# Patient Record
Sex: Female | Born: 1985 | Race: Black or African American | Hispanic: No | Marital: Single | Smoking: Current every day smoker
Health system: Southern US, Community
[De-identification: ages and names within clinical notes are randomized; demographics above are authoritative.]

## PROBLEM LIST (undated history)

## (undated) DIAGNOSIS — O2230 Deep phlebothrombosis in pregnancy, unspecified trimester: Secondary | ICD-10-CM

## (undated) DIAGNOSIS — R87629 Unspecified abnormal cytological findings in specimens from vagina: Secondary | ICD-10-CM

## (undated) DIAGNOSIS — I2699 Other pulmonary embolism without acute cor pulmonale: Secondary | ICD-10-CM

## (undated) HISTORY — DX: Unspecified abnormal cytological findings in specimens from vagina: R87.629

## (undated) HISTORY — PX: DILATION AND CURETTAGE OF UTERUS: SHX78

---

## 2017-12-20 NOTE — L&D Delivery Note (Signed)
Delivery Note At 9:04 AM a non-viable female was delivered via Vaginal, Spontaneous (Presentation: vertex).  APGAR: 0, 0; weight 2 lb 0.5 oz (920 g).   Placenta status:delivered, intact.  Cord: 3vc with the following complications: none.   Appearance of the baby showed no integrity to the fetal cranium, cord brown. No sloughing of skin.  Anesthesia:  fentanyl Episiotomy: None Lacerations: None Est. Blood Loss (mL): 100  Mom to postpartum.  Baby to [place].  Levie Heritage 10/10/2018, 12:02 PM

## 2018-10-10 ENCOUNTER — Inpatient Hospital Stay (HOSPITAL_COMMUNITY)
Admission: EM | Admit: 2018-10-10 | Discharge: 2018-10-11 | DRG: 805 | Disposition: A | Discharge status: Home / Self Care | Payer: 59 | Attending: Family Medicine | Admitting: Family Medicine

## 2018-10-10 ENCOUNTER — Emergency Department (HOSPITAL_COMMUNITY): Payer: 59

## 2018-10-10 ENCOUNTER — Other Ambulatory Visit: Payer: Self-pay

## 2018-10-10 ENCOUNTER — Encounter (HOSPITAL_COMMUNITY): Payer: Self-pay | Admitting: *Deleted

## 2018-10-10 DIAGNOSIS — Z86711 Personal history of pulmonary embolism: Secondary | ICD-10-CM

## 2018-10-10 DIAGNOSIS — Z87891 Personal history of nicotine dependence: Secondary | ICD-10-CM

## 2018-10-10 DIAGNOSIS — O4593 Premature separation of placenta, unspecified, third trimester: Secondary | ICD-10-CM | POA: Diagnosis present

## 2018-10-10 DIAGNOSIS — O4700 False labor before 37 completed weeks of gestation, unspecified trimester: Secondary | ICD-10-CM

## 2018-10-10 DIAGNOSIS — Z3689 Encounter for other specified antenatal screening: Secondary | ICD-10-CM

## 2018-10-10 DIAGNOSIS — Z3A34 34 weeks gestation of pregnancy: Secondary | ICD-10-CM

## 2018-10-10 DIAGNOSIS — O364XX0 Maternal care for intrauterine death, not applicable or unspecified: Principal | ICD-10-CM | POA: Diagnosis present

## 2018-10-10 DIAGNOSIS — O479 False labor, unspecified: Secondary | ICD-10-CM

## 2018-10-10 HISTORY — DX: Other pulmonary embolism without acute cor pulmonale: I26.99

## 2018-10-10 LAB — HIV ANTIBODY (ROUTINE TESTING W REFLEX): HIV SCREEN 4TH GENERATION: NONREACTIVE

## 2018-10-10 LAB — COMPREHENSIVE METABOLIC PANEL
ALT: 15 U/L (ref 0–44)
AST: 24 U/L (ref 15–41)
Albumin: 3 g/dL — ABNORMAL LOW (ref 3.5–5.0)
Alkaline Phosphatase: 95 U/L (ref 38–126)
Anion gap: 9 (ref 5–15)
BILIRUBIN TOTAL: 0.5 mg/dL (ref 0.3–1.2)
BUN: 7 mg/dL (ref 6–20)
CO2: 17 mmol/L — ABNORMAL LOW (ref 22–32)
Calcium: 9 mg/dL (ref 8.9–10.3)
Chloride: 107 mmol/L (ref 98–111)
Creatinine, Ser: 0.58 mg/dL (ref 0.44–1.00)
GFR calc Af Amer: >60 mL/min (ref >60)
GFR calc non Af Amer: >60 mL/min (ref >60)
GLUCOSE: 74 mg/dL (ref 70–99)
POTASSIUM: 3.5 mmol/L (ref 3.5–5.1)
Sodium: 133 mmol/L — ABNORMAL LOW (ref 135–145)
TOTAL PROTEIN: 6.7 g/dL (ref 6.5–8.1)

## 2018-10-10 LAB — APTT: aPTT: 29 seconds (ref 24–36)

## 2018-10-10 LAB — TYPE AND SCREEN
ABO/RH(D): B POS
ANTIBODY SCREEN: NEGATIVE

## 2018-10-10 LAB — CBC WITH DIFFERENTIAL/PLATELET
ABS IMMATURE GRANULOCYTES: 0.03 10*3/uL (ref 0.00–0.07)
BASOS ABS: 0 10*3/uL (ref 0.0–0.1)
Basophils Relative: 0 %
EOS ABS: 0.2 10*3/uL (ref 0.0–0.5)
Eosinophils Relative: 2 %
HEMATOCRIT: 39.3 % (ref 36.0–46.0)
HEMOGLOBIN: 13.6 g/dL (ref 12.0–15.0)
IMMATURE GRANULOCYTES: 0 %
LYMPHS ABS: 1.7 10*3/uL (ref 0.7–4.0)
LYMPHS PCT: 20 %
MCH: 31.1 pg (ref 26.0–34.0)
MCHC: 34.6 g/dL (ref 30.0–36.0)
MCV: 89.9 fL (ref 80.0–100.0)
MONOS PCT: 9 %
Monocytes Absolute: 0.8 10*3/uL (ref 0.1–1.0)
NEUTROS ABS: 5.8 10*3/uL (ref 1.7–7.7)
NEUTROS PCT: 69 %
NRBC: 0 % (ref 0.0–0.2)
Platelets: 181 10*3/uL (ref 150–400)
RBC: 4.37 MIL/uL (ref 3.87–5.11)
RDW: 13.8 % (ref 11.5–15.5)
WBC: 8.4 10*3/uL (ref 4.0–10.5)

## 2018-10-10 LAB — PROTIME-INR
INR: 0.96
Prothrombin Time: 12.7 seconds (ref 11.4–15.2)

## 2018-10-10 LAB — RPR: RPR: NONREACTIVE

## 2018-10-10 LAB — ABO/RH: ABO/RH(D): B POS

## 2018-10-10 MED ORDER — LACTATED RINGERS IV SOLN: 500.0000 mL | INTRAVENOUS | Status: DC | PRN

## 2018-10-10 MED ORDER — OXYTOCIN 40 UNITS IN LACTATED RINGERS INFUSION - SIMPLE MED
2.5000 [IU]/h | INTRAVENOUS | Status: DC
  Filled 2018-10-10: qty 1000

## 2018-10-10 MED ORDER — LACTATED RINGERS IV SOLN
INTRAVENOUS | Status: DC
  Administered 2018-10-10: 06:00:00 via INTRAVENOUS

## 2018-10-10 MED ORDER — ONDANSETRON HCL 4 MG/2ML IJ SOLN: 4.0000 mg | Freq: Four times a day (QID) | INTRAMUSCULAR | Status: DC | PRN

## 2018-10-10 MED ORDER — ENOXAPARIN SODIUM 30 MG/0.3ML ~~LOC~~ SOLN: 40.0000 mg | SUBCUTANEOUS | Status: DC

## 2018-10-10 MED ORDER — LIDOCAINE HCL (PF) 1 % IJ SOLN
30.0000 mL | INTRAMUSCULAR | Status: DC | PRN
  Filled 2018-10-10: qty 30

## 2018-10-10 MED ORDER — SOD CITRATE-CITRIC ACID 500-334 MG/5ML PO SOLN: 30.0000 mL | ORAL | Status: DC | PRN

## 2018-10-10 MED ORDER — ACETAMINOPHEN 325 MG PO TABS
650.0000 mg | ORAL_TABLET | ORAL | Status: DC | PRN
  Administered 2018-10-11: 650 mg via ORAL
  Filled 2018-10-10: qty 2

## 2018-10-10 MED ORDER — OXYCODONE-ACETAMINOPHEN 5-325 MG PO TABS: 1.0000 | ORAL_TABLET | ORAL | Status: DC | PRN

## 2018-10-10 MED ORDER — LACTATED RINGERS IV SOLN: INTRAVENOUS | Status: DC

## 2018-10-10 MED ORDER — PRENATAL MULTIVITAMIN CH
1.0000 | ORAL_TABLET | Freq: Every day | ORAL | Status: DC
  Administered 2018-10-11: 1 via ORAL
  Filled 2018-10-10: qty 1

## 2018-10-10 MED ORDER — ESCITALOPRAM OXALATE 10 MG PO TABS
10.0000 mg | ORAL_TABLET | Freq: Every day | ORAL | Status: DC
  Administered 2018-10-10 – 2018-10-11 (×2): 10 mg via ORAL
  Filled 2018-10-10 (×3): qty 1

## 2018-10-10 MED ORDER — ACETAMINOPHEN 325 MG PO TABS: 650.0000 mg | ORAL_TABLET | ORAL | Status: DC | PRN

## 2018-10-10 MED ORDER — WITCH HAZEL-GLYCERIN EX PADS: 1.0000 "application " | MEDICATED_PAD | CUTANEOUS | Status: DC | PRN

## 2018-10-10 MED ORDER — SIMETHICONE 80 MG PO CHEW: 80.0000 mg | CHEWABLE_TABLET | ORAL | Status: DC | PRN

## 2018-10-10 MED ORDER — ZOLPIDEM TARTRATE 5 MG PO TABS
5.0000 mg | ORAL_TABLET | Freq: Every evening | ORAL | Status: DC | PRN
  Administered 2018-10-10: 5 mg via ORAL
  Filled 2018-10-10: qty 1

## 2018-10-10 MED ORDER — OXYCODONE-ACETAMINOPHEN 5-325 MG PO TABS: 2.0000 | ORAL_TABLET | ORAL | Status: DC | PRN

## 2018-10-10 MED ORDER — DIBUCAINE 1 % RE OINT: 1.0000 "application " | TOPICAL_OINTMENT | RECTAL | Status: DC | PRN

## 2018-10-10 MED ORDER — MORPHINE SULFATE (PF) 4 MG/ML IV SOLN
2.0000 mg | INTRAVENOUS | Status: DC | PRN
  Administered 2018-10-10: 2 mg via INTRAVENOUS
  Filled 2018-10-10: qty 1

## 2018-10-10 MED ORDER — OXYTOCIN 40 UNITS IN LACTATED RINGERS INFUSION - SIMPLE MED: 1.0000 m[IU]/min | INTRAVENOUS | Status: DC

## 2018-10-10 MED ORDER — FENTANYL CITRATE (PF) 100 MCG/2ML IJ SOLN
50.0000 ug | INTRAMUSCULAR | Status: DC | PRN
  Administered 2018-10-10: 100 ug via INTRAVENOUS
  Filled 2018-10-10: qty 2

## 2018-10-10 MED ORDER — ENOXAPARIN SODIUM 30 MG/0.3ML ~~LOC~~ SOLN
30.0000 mg | SUBCUTANEOUS | Status: DC
  Administered 2018-10-10: 30 mg via SUBCUTANEOUS
  Filled 2018-10-10: qty 0.3

## 2018-10-10 MED ORDER — ONDANSETRON HCL 4 MG/2ML IJ SOLN: 4.0000 mg | INTRAMUSCULAR | Status: DC | PRN

## 2018-10-10 MED ORDER — ONDANSETRON HCL 4 MG PO TABS: 4.0000 mg | ORAL_TABLET | ORAL | Status: DC | PRN

## 2018-10-10 MED ORDER — BENZOCAINE-MENTHOL 20-0.5 % EX AERO: 1.0000 "application " | INHALATION_SPRAY | CUTANEOUS | Status: DC | PRN

## 2018-10-10 MED ORDER — TETANUS-DIPHTH-ACELL PERTUSSIS 5-2.5-18.5 LF-MCG/0.5 IM SUSP: 0.5000 mL | Freq: Once | INTRAMUSCULAR | Status: DC

## 2018-10-10 MED ORDER — DIPHENHYDRAMINE HCL 25 MG PO CAPS: 25.0000 mg | ORAL_CAPSULE | Freq: Four times a day (QID) | ORAL | Status: DC | PRN

## 2018-10-10 MED ORDER — OXYTOCIN BOLUS FROM INFUSION
500.0000 mL | Freq: Once | INTRAVENOUS | Status: AC
  Administered 2018-10-10: 500 mL via INTRAVENOUS

## 2018-10-10 MED ORDER — SENNOSIDES-DOCUSATE SODIUM 8.6-50 MG PO TABS
2.0000 | ORAL_TABLET | ORAL | Status: DC
  Administered 2018-10-11: 2 via ORAL
  Filled 2018-10-10: qty 2

## 2018-10-10 MED ORDER — COCONUT OIL OIL: 1.0000 "application " | TOPICAL_OIL | Status: DC | PRN

## 2018-10-10 MED ORDER — IBUPROFEN 600 MG PO TABS
600.0000 mg | ORAL_TABLET | Freq: Four times a day (QID) | ORAL | Status: DC
  Administered 2018-10-10 – 2018-10-11 (×3): 600 mg via ORAL
  Filled 2018-10-10 (×3): qty 1

## 2018-10-10 NOTE — ED Notes (Signed)
Patient immediately placed in a gown and placed on external fetal monitor.  Fetal HR of 144 found.  Marylene Land RN at bedside, along with [place], [place].

## 2018-10-10 NOTE — H&P (Signed)
Obstetric History and Physical  Chelsea Hall is a 32 y.o. [id number] with IUP at [redacted]w[redacted]d presenting for contractions. Found to be 4 cm. Denies bleeding or leaking of fluid. Seen at [facility] ED urgently for concern for fetal heart rate with IUFD confirmed bedside by myself. Transferred to [facility] for induction/augmentation of her labor.  Prenatal Course Source of Care: [place], has not been seen since Sept 1 Pregnancy complications or risks: Patient Active Problem List   Diagnosis Date Noted  . Fetal demise, greater than 22 weeks, antepartum 10/10/2018  . Pulmonary embolus [facility])    Medical History:  Past Medical History:  Diagnosis Date  . Pulmonary embolus [facility])     Past Surgical History:  Procedure Laterality Date  . DILATION AND CURETTAGE OF UTERUS      OB History  Gravida Para Term Preterm AB Living  4 1 1   2 1   SAB TAB Ectopic Multiple Live Births  2       1    # Outcome Date GA Lbr Len/2nd Weight Sex Delivery Anes PTL Lv  4 Current           3 SAB           2 SAB           1 Term         LIV    Social History   Socioeconomic History  . Marital status: Single    Spouse name: Not on file  . Number of children: Not on file  . Years of education: Not on file  . Highest education level: Not on file  Occupational History  . Not on file  Social Needs  . Financial resource strain: Not on file  . Food insecurity:    Worry: Not on file    Inability: Not on file  . Transportation needs:    Medical: Not on file    Non-medical: Not on file  Tobacco Use  . Smoking status: Former Games developer  . Smokeless tobacco: Never Used  Substance and Sexual Activity  . Alcohol use: Not Currently  . Drug use: Never  . Sexual activity: Not on file  Lifestyle  . Physical activity:    Days per week: Not on file    Minutes per session: Not on file  . Stress: Not on file  Relationships  . Social connections:    Talks on phone: Not on file    Gets together: Not on file    Attends religious  service: Not on file    Active member of club or organization: Not on file    Attends meetings of clubs or organizations: Not on file    Relationship status: Not on file  Other Topics Concern  . Not on file  Social History Narrative  . Not on file    History reviewed. No pertinent family history.  Medications Prior to Admission  Medication Sig Dispense Refill Last Dose  . enoxaparin (LOVENOX) 30 MG/0.3ML injection Inject 30 mg into the skin daily.   10/09/2018 at Unknown time    Allergies  Allergen Reactions  . Shellfish Allergy     Review of Systems: Negative except for what is mentioned in HPI.  Physical Exam: BP 137/86 (BP Location: Right Arm)   Pulse 86   Temp 98.4 F (36.9 C) (Oral)   Resp (!) 24   Ht 5\' 5"  (1.651 m)   Wt 62.6 kg   LMP 02/19/2018 (Within Weeks)   SpO2  100%   BMI 22.96 kg/m  CONSTITUTIONAL: Well-developed, well-nourished female in mild distress.  HENT:  Normocephalic, atraumatic, External right and left ear normal. Oropharynx is clear and moist EYES: Conjunctivae and EOM are normal. Pupils are equal, round, and reactive to light. No scleral icterus.  NECK: Normal range of motion, supple, no masses SKIN: Skin is warm and dry. No rash noted. Not diaphoretic. No erythema. No pallor. NEUROLOGIC: Alert and oriented to person, place, and time. Normal reflexes, muscle tone coordination. No cranial nerve deficit noted. PSYCHIATRIC: Normal mood and affect. Normal behavior. Normal judgment and thought content. CARDIOVASCULAR: Normal heart rate noted, regular rhythm RESPIRATORY: Effort and breath sounds normal, no problems with respiration noted ABDOMEN: Soft, nontender, nondistended, gravid. MUSCULOSKELETAL: Normal range of motion. No edema and no tenderness. 2+ distal pulses.  Cervical Exam: Dilatation 4 cm Presentation: cephalic FHT:  none Contractions: Every 5 mins   Pertinent Labs/Studies:   Results for orders placed or performed during the  hospital encounter of 10/10/18 (from the past 24 hour(s))  CBC with Differential     Status: None   Collection Time: 10/10/18  7:31 AM  Result Value Ref Range   WBC 8.4 4.0 - 10.5 K/uL   RBC 4.37 3.87 - 5.11 MIL/uL   Hemoglobin 13.6 12.0 - 15.0 g/dL   HCT 67.8 93.8 - 10.1 %   MCV 89.9 80.0 - 100.0 fL   MCH 31.1 26.0 - 34.0 pg   MCHC 34.6 30.0 - 36.0 g/dL   RDW 75.1 02.5 - 85.2 %   Platelets 181 150 - 400 K/uL   nRBC 0.0 0.0 - 0.2 %   Neutrophils Relative % 69 %   Neutro Abs 5.8 1.7 - 7.7 K/uL   Lymphocytes Relative 20 %   Lymphs Abs 1.7 0.7 - 4.0 K/uL   Monocytes Relative 9 %   Monocytes Absolute 0.8 0.1 - 1.0 K/uL   Eosinophils Relative 2 %   Eosinophils Absolute 0.2 0.0 - 0.5 K/uL   Basophils Relative 0 %   Basophils Absolute 0.0 0.0 - 0.1 K/uL   Immature Granulocytes 0 %   Abs Immature Granulocytes 0.03 0.00 - 0.07 K/uL  Comprehensive metabolic panel     Status: Abnormal   Collection Time: 10/10/18  7:31 AM  Result Value Ref Range   Sodium 133 (L) 135 - 145 mmol/L   Potassium 3.5 3.5 - 5.1 mmol/L   Chloride 107 98 - 111 mmol/L   CO2 17 (L) 22 - 32 mmol/L   Glucose, Bld 74 70 - 99 mg/dL   BUN 7 6 - 20 mg/dL   Creatinine, Ser 7.78 0.44 - 1.00 mg/dL   Calcium 9.0 8.9 - 24.2 mg/dL   Total Protein 6.7 6.5 - 8.1 g/dL   Albumin 3.0 (L) 3.5 - 5.0 g/dL   AST 24 15 - 41 U/L   ALT 15 0 - 44 U/L   Alkaline Phosphatase 95 38 - 126 U/L   Total Bilirubin 0.5 0.3 - 1.2 mg/dL   GFR calc non Af Amer >60 >60 mL/min   GFR calc Af Amer >60 >60 mL/min   Anion gap 9 5 - 15  Protime-INR     Status: None   Collection Time: 10/10/18  7:31 AM  Result Value Ref Range   Prothrombin Time 12.7 11.4 - 15.2 seconds   INR 0.96   APTT     Status: None   Collection Time: 10/10/18  7:31 AM  Result Value Ref Range  aPTT 29 24 - 36 seconds    Assessment : Chelsea Hall is a 32 y.o. G1P0 at [redacted]w[redacted]d being admitted for labor/augmentation with IUFD. IUFD confirmed by myself on bedside US at [facility] ED  after being called there urgently for concern for fetal well being. Fetus measuring [redacted]w[redacted]d with no fluid noted. She is 4 cm and contracting, will augment as needed.  Plan: Admit to L&D PEC, Admission labs ordered SW consult Pain relief prn Pitocin as needed Will cont lovenox post delivery     K. Therese Sarah, M.D. Center for [facility]  10/10/2018, 8:35 AM

## 2018-10-10 NOTE — ED Triage Notes (Signed)
Pt reports ongoing contractions since 2am that started out every hour and have become more frequent. G2P1, due 11/19/18. Pt is visiting from [place] where her OB is currently. LMP in March

## 2018-10-10 NOTE — Progress Notes (Signed)
RROB called to patient's bedside who presents to [facility] ED with complaints of contractions; upon arriving to patient's bedside patient stated she was from [place] visiting her mom; she is a G4P1 at 69 and 2/[redacted] weeks along in this pregnancy at this time; audible fetal movement noted immediately upon entering room; EFM adjusted for several minutes in attempts to find FHR;  LSaunders, PA and Dr Elesa Massed called to bedside to assist with bedside ultrasound to find FHR; Dr Earlene Plater called to come to patient's bedside for further assistance; limited OB ultrasound ordered Stat while Dr Earlene Plater at bedside

## 2018-10-10 NOTE — ED Provider Notes (Signed)
[facility] EMERGENCY DEPARTMENT Provider Note   CSN: [id number] Arrival date & time: 10/10/18  4098     History   Chief Complaint Chief Complaint  Patient presents with  . Laboring    HPI Tonisha Silvey is a 32 y.o. female.  The history is provided by the patient and medical records.    32 year old G2, P1 approximately 7 months gestation presenting to the ED with contractions.  This started around 2 AM.  Initially every 3 to 4 minutes, now feels more like every 2 minutes or so.  States it feels like a lot of pressure in her butt.  She denies any vaginal bleeding or loss of fluids.  States pregnancy thus far only complicated by hyperemesis gravidarum.  She is also on Lovenox injections due to history of DVT with prior pregnancy.  She did have preterm labor with her son, had to be admitted for IV medications to stop labor.  He was ultimately delivered 3 weeks early but no other noted complications.  She has not had any recent fever, chills, urinary symptoms, pelvic pain, or vaginal discharge.  Last OB visit in September 2019 with her OB in [place].  She is not currently on prenatal vitamins as they felt like that was the etiology of her emesis.  History reviewed. No pertinent past medical history.  There are no active problems to display for this patient.   OB History    Gravida  1   Para      Term      Preterm      AB      Living        SAB      TAB      Ectopic      Multiple      Live Births               Home Medications    Prior to Admission medications   Not on File    Family History No family history on file.  Social History Social History   Tobacco Use  . Smoking status: Former Games developer  . Smokeless tobacco: Never Used  Substance Use Topics  . Alcohol use: Not Currently  . Drug use: Never     Allergies   Shellfish allergy   Review of Systems Review of Systems  Gastrointestinal:       Contractions  All other systems  reviewed and are negative.    Physical Exam Updated Vital Signs BP (!) 141/97 (BP Location: Right Arm)   Pulse 86   Temp 98.2 F (36.8 C) (Oral)   Resp 20   Ht 5\' 5"  (1.651 m)   Wt 62.6 kg   LMP 02/19/2018 (Within Weeks)   SpO2 100%   BMI 22.96 kg/m   Physical Exam  Constitutional: She is oriented to person, place, and time. She appears well-developed and well-nourished.  Appears to be having contractions during exam  HENT:  Head: Normocephalic and atraumatic.  Mouth/Throat: Oropharynx is clear and moist.  Eyes: Pupils are equal, round, and reactive to light. Conjunctivae and EOM are normal.  Neck: Normal range of motion.  Cardiovascular: Normal rate, regular rhythm and normal heart sounds.  Pulmonary/Chest: Effort normal and breath sounds normal.  Abdominal: Soft. Bowel sounds are normal.  Gravid abdomen  Musculoskeletal: Normal range of motion.  Neurological: She is alert and oriented to person, place, and time.  Skin: Skin is warm and dry.  Psychiatric: She has a normal  mood and affect.  Nursing note and vitals reviewed.    ED Treatments / Results  Labs (all labs ordered are listed, but only abnormal results are displayed) Labs Reviewed - No data to display  EKG None  Radiology No results found.  Procedures Procedures (including critical care time)  Medications Ordered in ED Medications - No data to display   Initial Impression / Assessment and Plan / ED Course  I have reviewed the triage vital signs and the nursing notes.  Pertinent labs & imaging results that were available during my care of the patient were reviewed by me and considered in my medical decision making (see chart for details).  G2P1 approx 7 months gestation, presenting to the ED with contractions.  States this began around 2 AM, initially every 3 to 4 minutes, now every 2 minutes or so.  States she feels a lot of pressure in her bottom when this happens.  Has history of preterm labor  with her son who was ultimately delivered 3 weeks early.  Pregnancy thus far has been uncomplicated aside from hyperemesis gravidarum which has slowly been tapering off and appetite has increased.  She is on Lovenox injections due to history of DVT with prior pregnancy.  Last OB visit early September.  Patient is hemodynamically stable, nontoxic in appearance.  She denies any vaginal bleeding or loss of fluid.  Abdomen is gravid but soft.  She does appear to be having contractions during exam.  Initially on EFM FHR 144.  Rapid OB RN Wilkie Aye, arrived shortly after my initial assessment.  She has performed cervical check, estimates 4cm dilated.  Patient will be immediately transferred to [facility] hospital for ongoing management.     While facilitating transfer, rapid OB adjusting EFM due to difficulty picking up FHR.  I immediately went in to assess patient with bedside US-- fetus was in awkward, somewhat transverse position so unable to clearly visualize cardiac chambers, question of some trace cardiac activity but unable to doppler.  Attempted to apply pressure to patient's abdomen to try and reposition fetus for better visualization without success.   Rapid OB trying again with portable doppler, still with difficulty picking up FHR.  Rapid OB RN has contacted MAU attending, Dr. Earlene Plater to discuss case and current findings-- she is on her way over to assess patient as no current OB in house.  Meanwhile attending physician, Dr. Elesa Massed into assess with no visible cardiac activity on bedside US.  Second ED attending has come to bedside to evaluate with [place], no visible cardiac seen on their assessment either.  MAU attending, Dr. Earlene Plater arrived in the ED emergently to assess patient-- she is not seeing any visible cardiac activity on bedside ultrasound.  Formal US ordered, confirms no visible cardiac activity.  Fetus was noted to be very small (measuring approx 26 weeks) with very low fluid.  Patient was updated by  OB.  Patient will be transferred to MAU for delivery.    Final Clinical Impressions(s) / ED Diagnoses   Final diagnoses:  Preterm contractions    ED Discharge Orders    None       Garlon Hatchet, PA-C 10/10/18 0809    Ward, Layla Maw, DO 10/10/18 [id number]

## 2018-10-10 NOTE — Progress Notes (Signed)
[facility] Called for patient transfer; ETA 15 minutes

## 2018-10-10 NOTE — Progress Notes (Signed)
Called by Rapid Response RN to come to [facility] for patient @ 34 w with low fetal heart rate. Per ED attending, PA and OB rapid response RN, there had been fetal heart rate initially, but it was not able to be traced on doppler and the FHR was bradycardic on bedside US, at which point I was called.   I presented shortly thereafter and on evaluation with bedside US, there was no fetal activity and no FHR noted. Per ED staff, last confirmed FHR had been 30 min prior. Radiology called urgently for assistance with formal US to confirm no cardiac activity and obtain measurements. No fetal cardiac activity noted, no blood flow to fetal heart noted. Fetus cephalic. No fluid noted. States she is [redacted]w[redacted]d by dates, measuring [redacted]w[redacted]d on bedside limited. I reviewed there was no fetal cardiac activity, indicating a fetal demise. Patient appropriately upset and wondering if [place] could be wrong. I reviewed with her that formal US is confirmatory, and there was no cardiac activity noted while watching over period of several minutes. Patient asked why she was not being urgently delivered, and I reviewed that the risks to her with an urgent c-section are significant with minimal benefit as the fetus would be unlikely to be resuscitated with no cardiac activity. Patient and her mother appropriately upset and asking what next steps are. They appear to understand implications of demise and that fetus will not be born alive. I reviewed she will be transferred to [facility] for labor/augmentation as needed. Patient strongly desires to be able to hold baby.  Patient does state she has been measuring small but denies any known abnormalities on [place]. States she has been getting care in [place] but is here visiting and has not had visit since beginning of September. She has h/o 1 x term SVD, 2 x SAB, 1 x D&C. H/o PE on lovenox 30 mg daily, last dose was yesterday am and reports she has been compliant. Denies other medical issues. No history of pre-eclampsia,  diabetes, HTN.   She presented this am for contractions that are getting worse, has not been feeling much movement but feels occasionally. Denies leaking/bleeding.    Please see H&P for further details.  Baldemar Lenis, M.D. Center for [employer]

## 2018-10-10 NOTE — ED Provider Notes (Signed)
Medical screening examination/treatment/procedure(s) were conducted as a shared visit with non-physician practitioner(s) and myself.  I personally evaluated the patient during the encounter.  None  Patient is a 32 year old G2, P1 with history of DVT on Lovenox who is approximately 7 months pregnant due December 1 who presented to the emergency department for concerns for preterm labor.  Patient began having contractions around 2 AM.  Denies leaking fluid, vaginal bleeding.  States she has been "peeing a lot".  States she felt her baby move this morning but does not feel her move frequently.  Last prenatal care was in beginning of September.  Patient had preterm labor with her previous pregnancy and was delivered approximately 3 weeks early.  Patient here from [place].   Patient immediately brought from the triage area to a room.  Patient was immediately placed on external fetal heart monitor and tocometry by nursing staff and immediately seen by Sharilyn Sites, PA.  Fetal heart rate of 144 documented and appreciated by nursing staff and PA.  Christy, rapid OB nurse was at bedside within several minutes of being placed in the room.  Confirmed patient was having contractions and dilated to 4 cm.  Plan was to transfer patient to [facility] for preterm labor.  Patient was noted to be hypertensive in the ED.  Blood pressures in the 130s to 150s/80s to 90s.   While facilitating transfer, Christy, rapid OB nurse was at bedside with patient and could no longer pick up fetal heart tones.  Immediately Sharilyn Sites, PA went into room with Doppler and bedside ultrasound.  Question of possible slow cardiac activity on [place] visualized by PA but unable to doppler.  I came in immediately after PA and could not visualize any cardiac activity.  Called second ED physician to bedside to confirm.  None of [place] were able to appreciate any fetal activity or cardiac activity.  Rapid OB nurse contacted Dr. Earlene Plater, OB/GYN on-call,  immediately upon her concern for lack of fetal cardiac activity and Dr. Earlene Plater came emergently from [facility] hospital to [facility] to see the patient.  She confirmed that there was no fetal cardiac activity or fetal movement.  Formal ultrasound performed at bedside which confirmed again no cardiac activity, fetus measuring approximately 26 weeks 4 days with oligohydramnios.   Patient transferred to [facility] for delivery for fetal demise.  Dr. Earlene Plater recommended obtaining labs, urine prior to transfer.  OB aware of elevated blood pressures.  Will closely monitor at this time.   Chelsea Hall, Layla Maw, DO 10/10/18 [phone]

## 2018-10-11 LAB — CBC
HCT: 35.3 % — ABNORMAL LOW (ref 36.0–46.0)
Hemoglobin: 12.4 g/dL (ref 12.0–15.0)
MCH: 31.2 pg (ref 26.0–34.0)
MCHC: 35.1 g/dL (ref 30.0–36.0)
MCV: 88.9 fL (ref 80.0–100.0)
Platelets: 187 10*3/uL (ref 150–400)
RBC: 3.97 MIL/uL (ref 3.87–5.11)
RDW: 14.2 % (ref 11.5–15.5)
WBC: 9.1 10*3/uL (ref 4.0–10.5)
nRBC: 0 % (ref 0.0–0.2)

## 2018-10-11 LAB — HEPATITIS PANEL, ACUTE
HEP B C IGM: NEGATIVE
Hep A IgM: NEGATIVE
Hepatitis B Surface Ag: NEGATIVE

## 2018-10-11 LAB — RUBELLA SCREEN: Rubella: 2.35 index (ref >0.99)

## 2018-10-11 MED ORDER — ENOXAPARIN SODIUM 30 MG/0.3ML ~~LOC~~ SOLN: 40.0000 mg | SUBCUTANEOUS | 0 refills | Status: AC

## 2018-10-11 MED ORDER — IBUPROFEN 600 MG PO TABS: 600.0000 mg | ORAL_TABLET | Freq: Four times a day (QID) | ORAL | 0 refills | Status: AC

## 2018-10-11 MED ORDER — PRENATAL MULTIVITAMIN CH: 1.0000 | ORAL_TABLET | Freq: Every day | ORAL | 1 refills | Status: AC

## 2018-10-11 MED ORDER — TRIAMTERENE-HCTZ 37.5-25 MG PO TABS: 1.0000 | ORAL_TABLET | Freq: Every day | ORAL | 0 refills | Status: AC

## 2018-10-11 MED ORDER — ZOLPIDEM TARTRATE 5 MG PO TABS: 5.0000 mg | ORAL_TABLET | Freq: Every evening | ORAL | 0 refills | Status: DC | PRN

## 2018-10-11 MED ORDER — ESCITALOPRAM OXALATE 10 MG PO TABS: 10.0000 mg | ORAL_TABLET | Freq: Every day | ORAL | 2 refills | Status: AC

## 2018-10-11 NOTE — Lactation Note (Signed)
Lactation Consultation Note  Patient Name: Chelsea Hall Today's Date: 10/11/2018   Spoke to Chaplain regarding patient this am.  Patient having a difficult morning emotionally.  Brochure on Lactation after Loss given to Chaplain as she stated she was going back in to speak with with her, and she would be happy to give it to her.   Lactation brochure given, and phone number identified on back.  Encouraged her to call for any concerns after discharge.  Judee Clara 10/11/2018, 12:04 PM

## 2018-10-11 NOTE — Progress Notes (Signed)
I offered grief support to Chelsea Hall after the loss of her daughter, Chelsea Hall.  She was able to use the time well to process some of her feelings.  She has a strong faith which is helping her get through this.  She lost 2 siblings at the age of 30 months (SIDS) and a year (cerebral palsy) when she was younger (around age 32).  Her mother has been a good support for her and she is comforted to know that she can be a support for her son, Chelsea Hall, (age 33) having experienced the loss of a sibling herself.    I brought her resources for [facility] Grief counseling and let her know about our services that we offer through the Comfort program as well.  I also offered prayer, at her request.    Chelsea Hall, Bcc Pager, [phone] 12:19 PM    10/11/18 1200  Clinical Encounter Type  Visited With Patient  Visit Type Spiritual support  Referral From Social work  Spiritual Encounters  Spiritual Needs Grief support  Stress Factors  Patient Stress Factors Loss

## 2018-10-11 NOTE — Discharge Summary (Signed)
Obstetric Discharge Summary Reason for Admission: FDIU d/t placental abruption Prenatal Procedures: ultrasound Intrapartum Procedures: spontaneous vaginal delivery Postpartum Procedures: none Complications-Operative and Postpartum: none Hemoglobin  Date Value Ref Range Status  10/11/2018 12.4 12.0 - 15.0 g/dL Final   HCT  Date Value Ref Range Status  10/11/2018 35.3 (L) 36.0 - 46.0 % Final    Hospital course: Pt was admitted with above Dx. Had been receiving care in [place]. Was in town visiting family. H/O DVT in prior pregnancy and was on Lovenox.  Restarted at prophylactic dose on PPD # 1.  Pt had SVD without problems. PEC work up was negative. Postpartum course was unremarkable. Was seen by chaplin and appropriate grieving noted. Was started on Lexapro.  BP's were liable postpartum but no S/Sx of PEC. But started on low dose Maxzide x for 1 week Progressed to ambulating, voiding, tolerating diet and good oral pain control.  Pt verbalized she intends to stay in town with family. Thus postpartum care was arranged for clinic here.  PPD# 1 felt pt was amendable for discharge home. Discharge medications, instructions and follow up reviewed with pt. Pt verbalized understanding.  Physical Exam:  General: alert Lochia: appropriate Uterine Fundus: firm Incision: healing well DVT Evaluation: No evidence of DVT seen on physical exam.  Discharge Diagnoses: SVD of FDIU  Discharge Information: Date: 10/11/2018 Activity: pelvic rest Diet: routine Medications: PNV, Ibuprofen and Ambien and Lovenox Condition: stable Instructions: refer to practice specific booklet Discharge to: home Follow-up Information    [facility]. Schedule an appointment as soon as possible for a visit in 1 week(s).   Why:  1 week for BP check and 4 weeks for postpartum visit. Contact information: [address] [place] [address] [phone]          Newborn Data: Live  born female  Birth Weight: 2 lb 0.5 oz (920 g) APGAR: 0, 0  Newborn Delivery   Birth date/time:  10/10/2018 09:04:00 Delivery type:  Vaginal, Spontaneous       Hermina Staggers 10/11/2018, 11:35 AM

## 2018-10-11 NOTE — Progress Notes (Signed)
CSW received consult due to IUFD.  CSW available for support secondary to [facility] and will await call from Chaplain before becoming involved.  CSW screening out referral at this time.  CSW also left voicemail message for hospital's financial counselor Janett Billow) for assistance.   Blaine Hamper, MSW, LCSW Clinical Social Work [phone]

## 2018-10-11 NOTE — Discharge Instructions (Signed)
Vaginal Delivery, Care After °Refer to this sheet in the next few weeks. These instructions provide you with information on caring for yourself after your delivery. Your health care provider may also give you more specific instructions. Your treatment has been planned according to current medical practices, but problems sometimes occur. Call your health care provider if you have any problems or questions after your delivery. °What to expect after your delivery °After your delivery, it is typical to have the following: °· You may feel pain in the vaginal area for several days after delivery. If you had an incision or a vaginal tear, the area will probably continue to be tender to the touch for several weeks. °· You may feel very fatigued after a vaginal delivery. °· You may have vaginal bleeding and discharge that will start out red, then become pink, then yellow, then white. Altogether, this usually lasts for about 6 weeks. °· The combination of having lost your baby and changing hormones from the delivery can make you feel very sad. You may also experience emotions that change very quickly. Some of the emotions people often notice after loss include: °? Anger. °? Denial. °? Guilt. °? Sorrow. °? Depression. °? Grief. °? Relationship problems. ° °Follow these instructions at home: °· Consider seeking support for your loss. Some forms of support that you might consider include your religious leader, friends, family, a professional counselor, or a bereavement support group. °· Take medicines only as directed by your health care provider. °· Continue to use good perineal care. Good perineal care includes: °? Wiping your perineum from front to back. °? Keeping your perineum clean. °· Do not use tampons or douche until your health care provider says it is okay. °· Shower, wash your hair, and take tub baths as directed by your health care provider. °· Wear a well-fitting bra that provides breast support. °· Drink enough  fluids to keep your urine clear or pale yellow. °· Eat healthy foods. °· Eat high-fiber foods every day, such as whole grain cereals and breads, brown rice, beans, and fresh fruits and vegetables. These foods may help prevent or relieve constipation. °· Follow your health care provider's directions about resuming activities such as climbing stairs, driving, lifting, exercising, or traveling. °· Increase your activities gradually. °· Talk to your health care provider about resuming sexual activities. This depends on your risk of infection, your rate of healing, and your comfort and desire to resume sexual activity. °· Try to have someone help you with your household activities for at least a few days after you leave the hospital. °· Rest as much as possible. °· Keep all of your scheduled postpartum appointments. It is very important to keep your scheduled follow-up appointments. At these appointments, your health care provider will be checking to make sure that you are healing physically and emotionally. °· Do not drink alcohol, especially if you are taking medicine to relieve pain. °· Do not use any tobacco products including cigarettes, chewing tobacco, or electronic cigarettes. If you need help quitting, ask your health care provider. °· Do not use illegal drugs. °Contact a health care provider if: °· You feel sad or depressed. °· You have thoughts of hurting yourself. °· You are having trouble eating or sleeping. °· You cannot enjoy the things in life you have previously enjoyed. °· You are passing large clots from your vagina. Save any clots to show your health care provider. °· You have a bad smelling discharge from your vagina. °·   You have trouble urinating. °· You are urinating frequently. °· You have pain when you urinate. °· You have a change in your bowel movements. °· You have increasing redness, pain, or swelling near your incision or vaginal tear. °· You have pus draining from your incision or vaginal  tear. °· Your incision or vaginal tear is separating. °· You have painful, hard, or reddened breasts. °· You have a severe headache. °· You have blurred vision or see spots. °· You are dizzy or light-headed. °· You have a rash. °· You have nausea or vomiting. °· You have not had a menstrual period by the 12th week after delivery. °· You have a fever. °Get help right away if: °· You are concerned that you may hurt yourself or you are considering suicide. °· You have persistent pain. °· You have chest pain. °· You have shortness of breath. °· You faint. °· You have leg pain. °· You have stomach pain. °· Your vaginal bleeding saturates two or more sanitary pads in 1 hour. °This information is not intended to replace advice given to you by your health care provider. Make sure you discuss any questions you have with your health care provider. °Document Released: 04/22/2014 Document Revised: 05/13/2016 Document Reviewed: 01/24/2014 °Elsevier Interactive Patient Education © 2018 [employer] ° °

## 2018-10-11 NOTE — Progress Notes (Signed)
Discharge teaching complete with pt. Pt understood all information and did not have any questions. Pt discharged home to family. 

## 2018-10-18 ENCOUNTER — Encounter: Payer: Self-pay | Admitting: *Deleted

## 2018-10-19 ENCOUNTER — Encounter: Payer: Self-pay | Admitting: Family Medicine

## 2018-10-19 ENCOUNTER — Ambulatory Visit (INDEPENDENT_AMBULATORY_CARE_PROVIDER_SITE_OTHER): Payer: 59 | Admitting: Family Medicine

## 2018-10-19 DIAGNOSIS — Z86711 Personal history of pulmonary embolism: Secondary | ICD-10-CM

## 2018-10-19 DIAGNOSIS — O364XX0 Maternal care for intrauterine death, not applicable or unspecified: Secondary | ICD-10-CM

## 2018-10-19 DIAGNOSIS — Z1389 Encounter for screening for other disorder: Secondary | ICD-10-CM

## 2018-10-19 NOTE — Progress Notes (Signed)
Post Partum Exam  Chelsea Hall is a 32 y.o. [phone] female who presents for a postpartum visit. She is 1 week postpartum following a spontaneous vaginal delivery. I have fully reviewed the prenatal and intrapartum course. The delivery was at  34 gestational weeks.  Anesthesia: none.  Delivered IUFD. Bleeding thin lochia. Bowel function is normal. Bladder function is normal. Patient is not sexually active. Contraception method is none. Postpartum depression screening: positive (score:14)   Last pap smear done 01-2018 and was Abnormal (patient reports; no records)  Review of Systems Pertinent items are noted in HPI.    Objective:  Blood pressure 119/89, pulse 92, height 5' 4.25" (1.632 m), weight 130 lb (59 kg).  General:  alert, cooperative and no distress  Lungs: clear to auscultation bilaterally  Heart:  regular rate and rhythm, S1, S2 normal, no murmur, click, rub or gallop  Abdomen: soft, non-tender; bowel sounds normal; no masses,  no organomegaly        Assessment:    Normal postpartum exam. Pap smear not done at today's visit.   Plan:   1. Contraception: IUD - will return in 3 weeks for insertion 2. Continue lexapro 3. Continue lovenox x 6 weeks 4. Follow up in: 3 weeks or as needed.

## 2018-11-09 ENCOUNTER — Ambulatory Visit (INDEPENDENT_AMBULATORY_CARE_PROVIDER_SITE_OTHER): Payer: 59 | Admitting: Family Medicine

## 2018-11-09 ENCOUNTER — Encounter: Payer: Self-pay | Admitting: Family Medicine

## 2018-11-09 DIAGNOSIS — Z30430 Encounter for insertion of intrauterine contraceptive device: Secondary | ICD-10-CM

## 2018-11-09 DIAGNOSIS — Z3202 Encounter for pregnancy test, result negative: Secondary | ICD-10-CM

## 2018-11-09 DIAGNOSIS — O364XX0 Maternal care for intrauterine death, not applicable or unspecified: Secondary | ICD-10-CM

## 2018-11-09 DIAGNOSIS — Z86711 Personal history of pulmonary embolism: Secondary | ICD-10-CM

## 2018-11-09 LAB — POCT URINE PREGNANCY: PREG TEST UR: NEGATIVE

## 2018-11-09 MED ORDER — LEVONORGESTREL 19.5 MCG/DAY IU IUD
INTRAUTERINE_SYSTEM | Freq: Once | INTRAUTERINE | Status: AC
  Administered 2018-11-09: 10:00:00 via INTRAUTERINE

## 2018-11-09 NOTE — Patient Instructions (Addendum)
For the lexapro: Take 1/2 a tablet daily for 2 weeks, then 1/2 a tab every other day for 2 weeks, then stop.   IUD PLACEMENT POST-PROCEDURE INSTRUCTIONS  1. You may take Ibuprofen, Aleve or Tylenol for pain if needed.  Cramping should resolve within in 24 hours.  2. You may have a small amount of spotting.  You should wear a mini pad for the next few days.  3. You may have intercourse after 24 hours.  If you using this for birth control, it is effective immediately.  4. You need to call if you have any pelvic pain, fever, heavy bleeding or foul smelling vaginal discharge.  Irregular bleeding is common the first several months after having an IUD placed. You do not need to call for this reason unless you are concerned.  5. Shower or bathe as normal  6. You should have a follow-up appointment in 4-8 weeks for a re-check to make sure you are not having any problems.

## 2018-11-09 NOTE — Progress Notes (Signed)
Subjective:     Chelsea Hall is a 32 y.o. female who presents for a postpartum visit. She is 4 weeks postpartum following a spontaneous vaginal delivery. I have fully reviewed the prenatal and intrapartum course. The delivery was at 34 gestational weeks. Outcome: spontaneous vaginal delivery. Anesthesia: Fentanyl. Postpartum course has been normal. Bleeding staining only. Bowel function is abnormal: constipation . Bladder function is normal. Patient is not sexually active. Contraception method is IUD. Postpartum depression screening: negative.  Would like to wean off antidepressant.  The following portions of the patient's history were reviewed and updated as appropriate: allergies, current medications, past family history, past medical history, past social history, past surgical history and problem list.  Review of Systems Pertinent items noted in HPI and remainder of comprehensive ROS otherwise negative.   Objective:    There were no vitals taken for this visit.  General:  alert, cooperative and no distress  Lungs: clear to auscultation bilaterally  Heart:  regular rate and rhythm, S1, S2 normal, no murmur, click, rub or gallop  Abdomen: soft, non-tender; bowel sounds normal; no masses,  no organomegaly   Vulva:  normal  Vagina: normal vagina, no discharge, exudate, lesion, or erythema  Cervix:  multiparous appearance   IUD Procedure Note Patient identified, informed consent performed, signed copy in chart, time out was performed.  Urine pregnancy test negative.  Speculum placed in the vagina.  Cervix visualized.  Cleaned with Betadine x 2.  Grasped anteriorly with a single tooth tenaculum.  Uterus sounded to 10 cm.  Liletta  IUD placed per manufacturer's recommendations.  Strings trimmed to 3 cm. Tenaculum was removed, good hemostasis noted.  Patient tolerated procedure well.   Patient given post procedure instructions and Liletta care card with expiration date.  Patient is asked to  check IUD strings periodically and follow up in 4-6 weeks for IUD check.       Assessment:     normal postpartum exam. Pap smear done at today's visit.   Plan:    1. Contraception: IUD 2. Wean off lexapro: 1/2 tab daily x2 weeks, then every other day for 2 weeks, then stop. 3. Follow up in: 1 month or as needed.

## 2018-11-13 ENCOUNTER — Other Ambulatory Visit: Payer: Self-pay | Admitting: Family Medicine

## 2018-11-13 ENCOUNTER — Telehealth: Payer: Self-pay

## 2018-11-13 LAB — CYTOLOGY - PAP
Bacterial vaginitis: POSITIVE — AB
CANDIDA VAGINITIS: NEGATIVE
Diagnosis: NEGATIVE
HPV (WINDOPATH): NOT DETECTED

## 2018-11-13 MED ORDER — METRONIDAZOLE 500 MG PO TABS: 500.0000 mg | ORAL_TABLET | Freq: Two times a day (BID) | ORAL | 0 refills | Status: AC

## 2018-11-13 NOTE — Telephone Encounter (Signed)
-----   Message from Levie HeritageJacob J Stinson, DO sent at 11/13/2018  1:31 PM EST ----- Patient has BV. Flagyl sent to pharmacy.

## 2018-11-13 NOTE — Telephone Encounter (Signed)
Called pt about positive BV results.Pt is aware that Flagyl was sent to pharmacy. Understanding was voiced.

## 2018-12-07 ENCOUNTER — Ambulatory Visit: Payer: 59 | Admitting: Family Medicine

## 2018-12-07 DIAGNOSIS — Z09 Encounter for follow-up examination after completed treatment for conditions other than malignant neoplasm: Secondary | ICD-10-CM

## 2018-12-27 ENCOUNTER — Ambulatory Visit: Payer: Managed Care, Other (non HMO) | Admitting: Obstetrics and Gynecology

## 2018-12-27 DIAGNOSIS — Z30431 Encounter for routine checking of intrauterine contraceptive device: Secondary | ICD-10-CM

## 2020-07-10 IMAGING — US US OB LIMITED
1 series · 14 of 24 positions shown · non-contrast
Comparison: none

CLINICAL DATA: Confirm fetal cardiac activity

EXAM:
LIMITED OBSTETRIC ULTRASOUND

[Series 1: us ob limited · 0.23mm/px · 24 acquisitions, 14 frames shown]
[im 1/24]
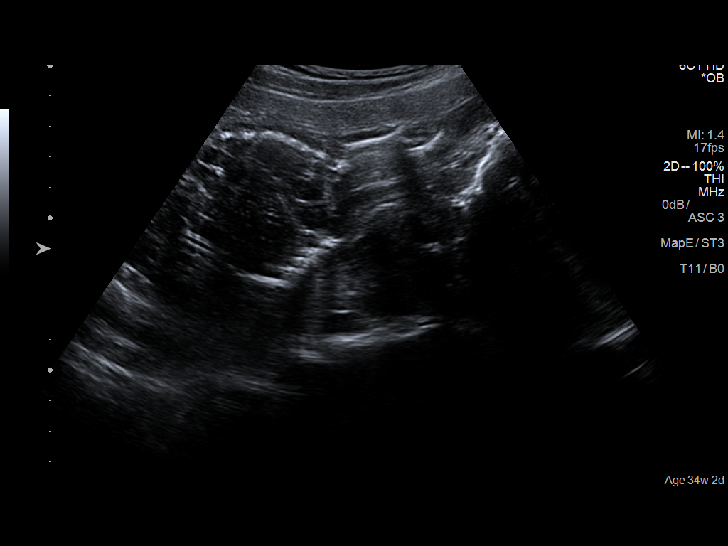
[im 3/24]
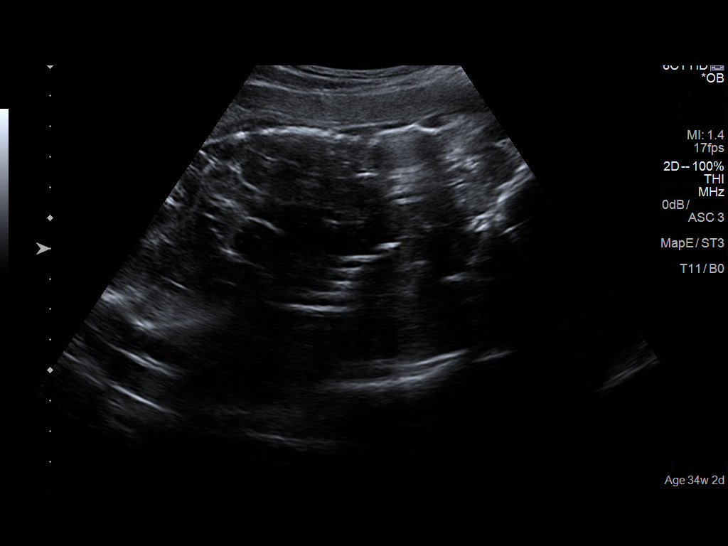
[im 5/24]
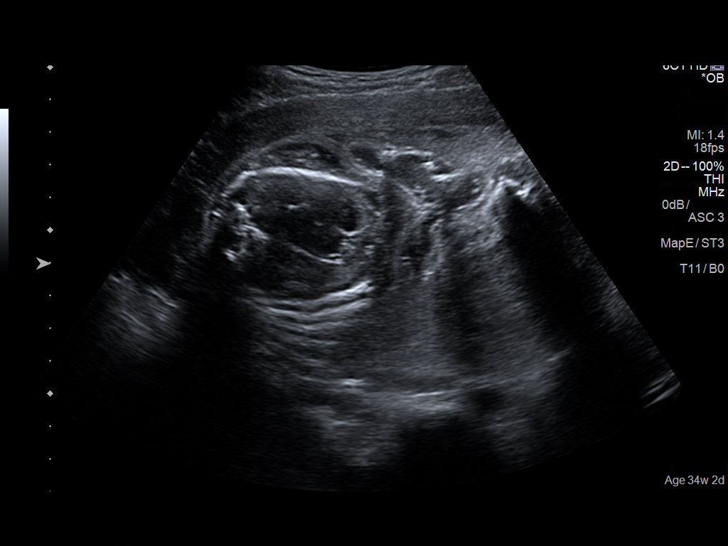
[im 7/24]
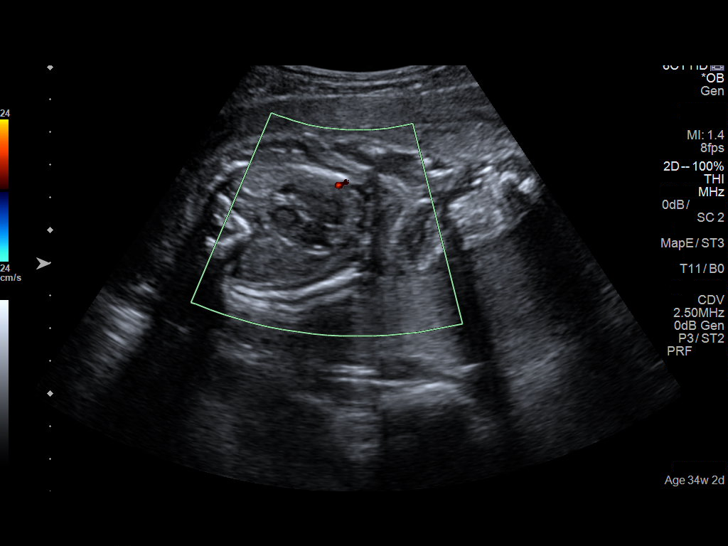
[im 8/24]
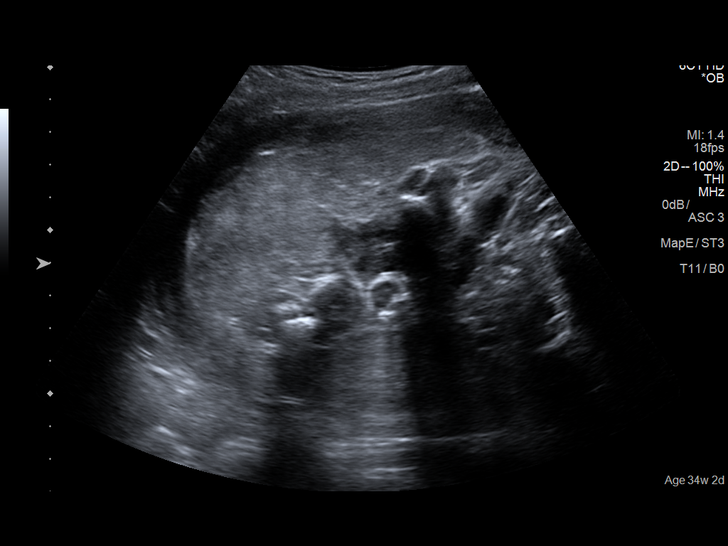
[im 10/24]
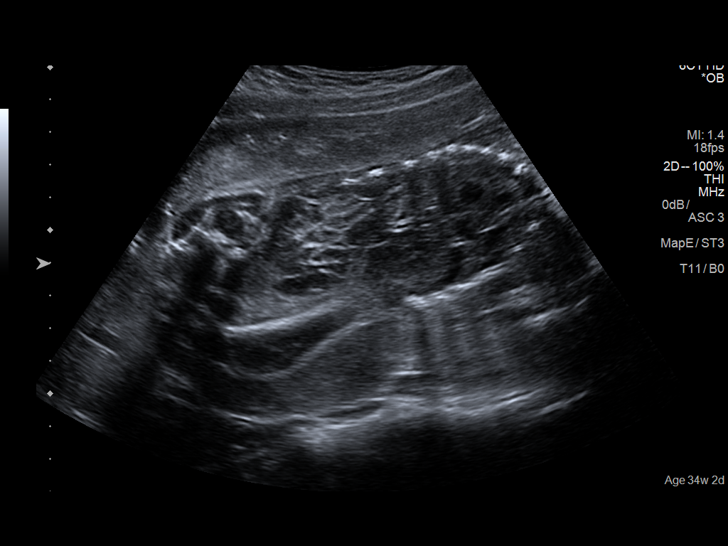
[im 12/24]
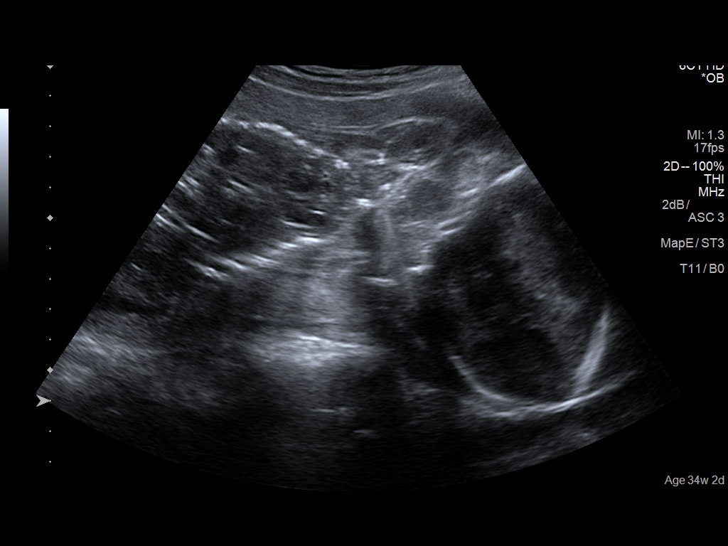
[im 13/24]
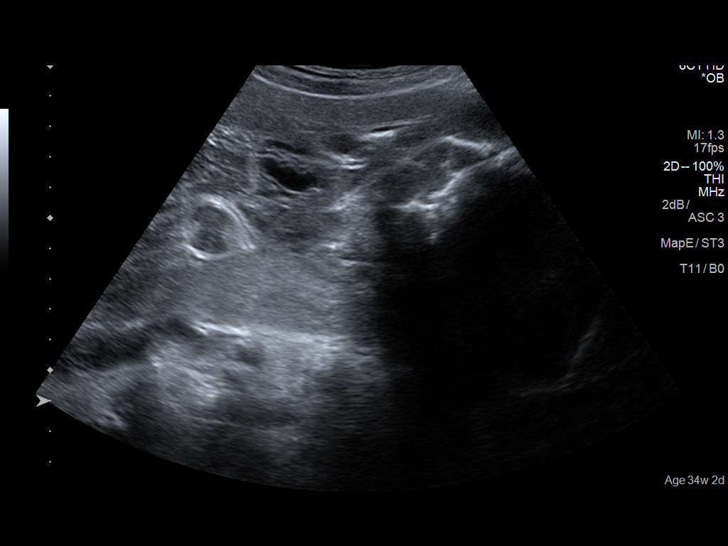
[im 15/24]
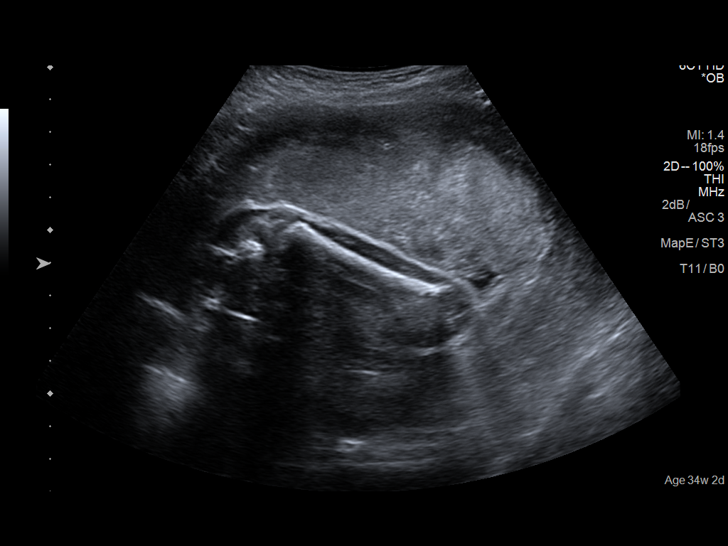
[im 17/24]
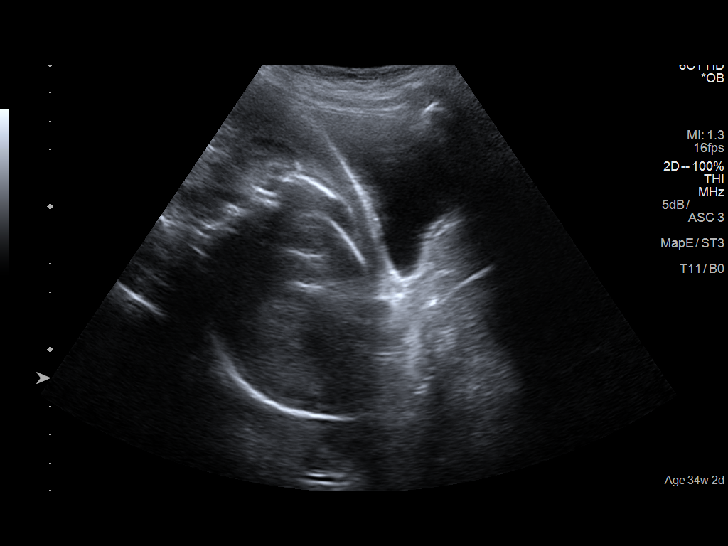
[im 19/24]
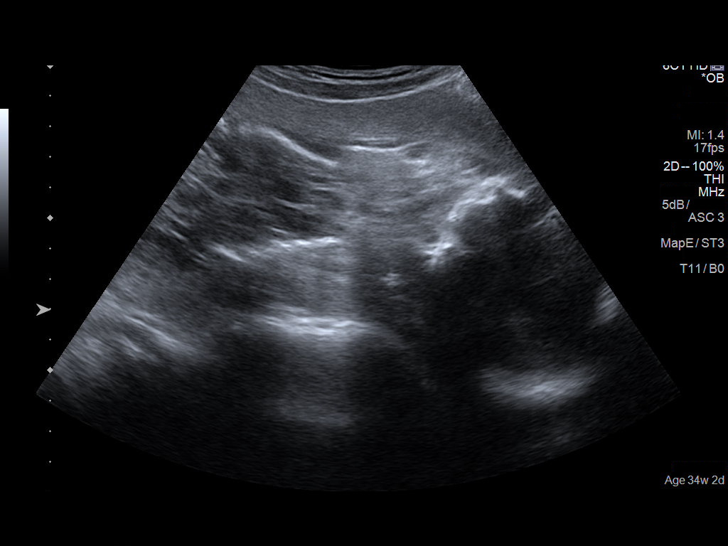
[im 20/24]
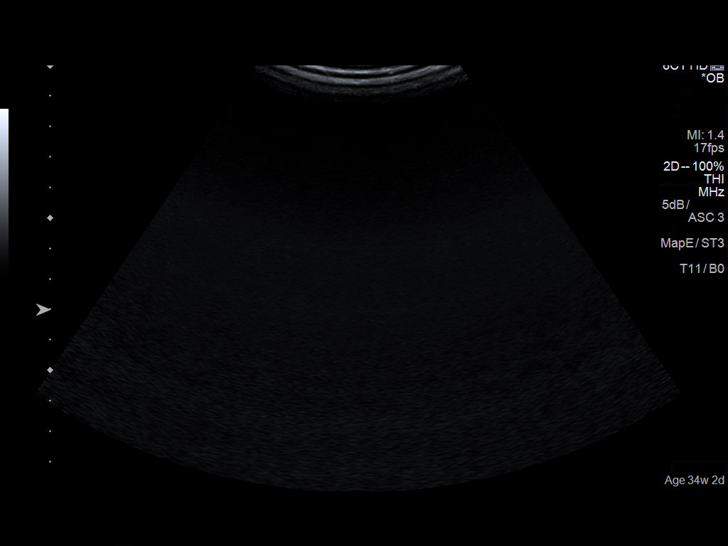
[im 22/24]
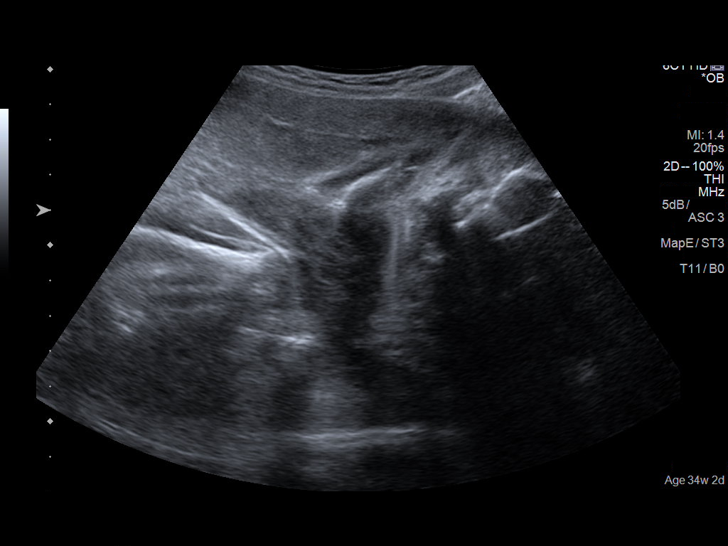
[im 24/24]
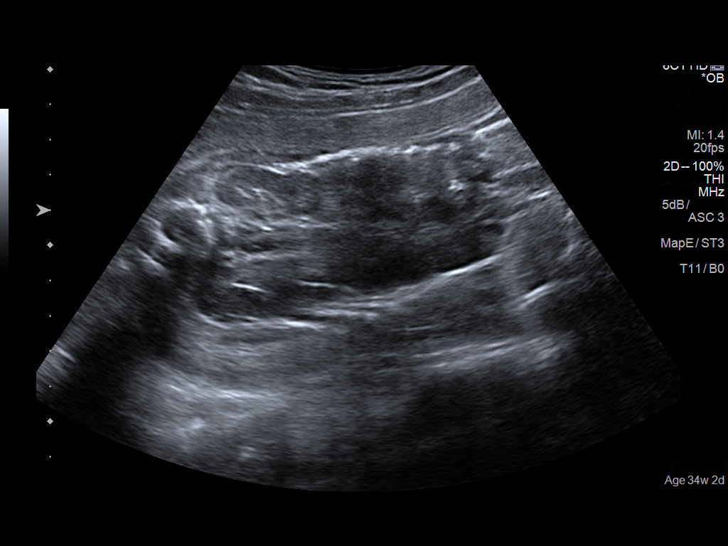

[14 of 24 positions shown; findings below may reference images not displayed]

FINDINGS: Number of Fetuses: 1

Heart Rate: Absent

Movement: Absent

Presentation: Cephalic

Placental Location: Fundal

Previa: Absent

Amniotic Fluid (Subjective):  Decreased

BPD: 4.93 cm 26 w  4 d

MATERNAL FINDINGS:

Cervix:  Appears closed.

Uterus/Adnexae: No abnormality visualized.
IMPRESSION: There is a confirmed lack of fetal cardiac activity indicating fetal
demise.

This exam is performed on an emergent basis and does not
comprehensively evaluate fetal size, dating, or anatomy; follow-up
complete OB US should be considered if further fetal assessment is
warranted.

## 2020-10-03 ENCOUNTER — Emergency Department (HOSPITAL_BASED_OUTPATIENT_CLINIC_OR_DEPARTMENT_OTHER)
Admission: EM | Admit: 2020-10-03 | Discharge: 2020-10-03 | Disposition: A | Discharge status: Home / Self Care | Payer: Self-pay | Attending: Emergency Medicine | Admitting: Emergency Medicine

## 2020-10-03 ENCOUNTER — Emergency Department (HOSPITAL_BASED_OUTPATIENT_CLINIC_OR_DEPARTMENT_OTHER): Payer: Self-pay

## 2020-10-03 ENCOUNTER — Encounter (HOSPITAL_BASED_OUTPATIENT_CLINIC_OR_DEPARTMENT_OTHER): Payer: Self-pay | Admitting: Emergency Medicine

## 2020-10-03 ENCOUNTER — Other Ambulatory Visit: Payer: Self-pay

## 2020-10-03 DIAGNOSIS — F17210 Nicotine dependence, cigarettes, uncomplicated: Secondary | ICD-10-CM | POA: Insufficient documentation

## 2020-10-03 DIAGNOSIS — R599 Enlarged lymph nodes, unspecified: Secondary | ICD-10-CM | POA: Insufficient documentation

## 2020-10-03 DIAGNOSIS — A599 Trichomoniasis, unspecified: Secondary | ICD-10-CM | POA: Insufficient documentation

## 2020-10-03 DIAGNOSIS — B9689 Other specified bacterial agents as the cause of diseases classified elsewhere: Secondary | ICD-10-CM | POA: Insufficient documentation

## 2020-10-03 DIAGNOSIS — N760 Acute vaginitis: Secondary | ICD-10-CM | POA: Insufficient documentation

## 2020-10-03 LAB — CBC WITH DIFFERENTIAL/PLATELET
Abs Immature Granulocytes: 0.02 10*3/uL (ref 0.00–0.07)
Basophils Absolute: 0.1 10*3/uL (ref 0.0–0.1)
Basophils Relative: 1 %
Eosinophils Absolute: 0.1 10*3/uL (ref 0.0–0.5)
Eosinophils Relative: 2 %
HCT: 44.4 % (ref 36.0–46.0)
Hemoglobin: 15.3 g/dL — ABNORMAL HIGH (ref 12.0–15.0)
Immature Granulocytes: 0 %
Lymphocytes Relative: 31 %
Lymphs Abs: 2.8 10*3/uL (ref 0.7–4.0)
MCH: 32.3 pg (ref 26.0–34.0)
MCHC: 34.5 g/dL (ref 30.0–36.0)
MCV: 93.9 fL (ref 80.0–100.0)
Monocytes Absolute: 0.8 10*3/uL (ref 0.1–1.0)
Monocytes Relative: 9 %
Neutro Abs: 5.2 10*3/uL (ref 1.7–7.7)
Neutrophils Relative %: 57 %
Platelets: 258 10*3/uL (ref 150–400)
RBC: 4.73 MIL/uL (ref 3.87–5.11)
RDW: 13.1 % (ref 11.5–15.5)
WBC: 9 10*3/uL (ref 4.0–10.5)
nRBC: 0 % (ref 0.0–0.2)

## 2020-10-03 LAB — URINALYSIS, ROUTINE W REFLEX MICROSCOPIC
Bilirubin Urine: NEGATIVE
Glucose, UA: NEGATIVE mg/dL
Ketones, ur: NEGATIVE mg/dL
Leukocytes,Ua: NEGATIVE
Nitrite: NEGATIVE
Protein, ur: NEGATIVE mg/dL
Specific Gravity, Urine: 1.02 (ref 1.005–1.030)
pH: 8 (ref 5.0–8.0)

## 2020-10-03 LAB — HIV ANTIBODY (ROUTINE TESTING W REFLEX): HIV Screen 4th Generation wRfx: NONREACTIVE

## 2020-10-03 LAB — WET PREP, GENITAL
Sperm: NONE SEEN
Yeast Wet Prep HPF POC: NONE SEEN

## 2020-10-03 LAB — URINALYSIS, MICROSCOPIC (REFLEX)

## 2020-10-03 LAB — PREGNANCY, URINE: Preg Test, Ur: NEGATIVE

## 2020-10-03 MED ORDER — METRONIDAZOLE 500 MG PO TABS: 500.0000 mg | ORAL_TABLET | Freq: Two times a day (BID) | ORAL | 0 refills | Status: AC

## 2020-10-03 NOTE — ED Triage Notes (Signed)
Reports vaginal discharge since Monday.  Also noticed swollen area to right groin that feels like an abscess since Wednesday.

## 2020-10-03 NOTE — ED Provider Notes (Signed)
[facility] EMERGENCY DEPARTMENT Provider Note   CSN: [id number] Arrival date & time: 10/03/20  1036     History Chief Complaint  Patient presents with  . Vaginal Discharge    Chelsea Hall is a 34 y.o. female.  HPI   Patient with a significant medical history of PE, abnormal Pap smear presents to the emergency department with chief complaint of vaginal discharge and right sided lump in her groin.  Patient states she noticed the vaginal discharge on Monday, she states she thinks its a yeast infection had it in the past and she states it feels exactly like this.  She denies vaginal odor, vaginal bleeding, urinary symptoms urinary urgency, frequency, hematuria, lower back pain, abdominal pain, nausea or vomiting.  She states she is sexually active and has not been checked for STD in a long time.  She does endorse that she has had herpes in the past but is currently not having any flareups at this time.  She also mentions that she noticed a mass in her right groin.  She states she noticed this on Wednesday, she states it a constant dull sensation and becomes a sharp pain when she is up moving around.  She denies ever experienced in the past.  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  She denies tick bites, rashes, joint pain, headaches, fevers or chills, night sweats, unexplained weight loss, bone pain.  Patient denies headache, fever, chills, shortness of breath, chest pain, dumping, nausea, vomiting, diarrhea, needle edema.  Past Medical History:  Diagnosis Date  . Pulmonary embolus (HCC)   . Vaginal Pap smear, abnormal     Patient Active Problem List   Diagnosis Date Noted  . IUFD at 20 weeks or more of gestation 10/19/2018  . History of pulmonary embolus (PE)     Past Surgical History:  Procedure Laterality Date  . DILATION AND CURETTAGE OF UTERUS       OB History    Gravida  4   Para  2     Term  1   Preterm  1   AB  2   Living  1     SAB  2   TAB      Ectopic      Multiple  0   Live Births  1           Family History  Problem Relation Age of Onset  . Anxiety disorder Mother   . Miscarriages / [place] Mother   . Stroke Maternal Aunt   . Cancer Paternal Aunt   . Alcohol abuse Maternal Grandmother   . COPD Maternal Grandmother   . Diabetes Maternal Grandmother   . Hyperlipidemia Maternal Grandmother   . Hypertension Maternal Grandmother   . Stroke Maternal Grandmother     Social History   Tobacco Use  . Smoking status: Current Every Day Smoker    Packs/day: 1.00    Types: Cigarettes  . Smokeless tobacco: Never Used  Vaping Use  . Vaping Use: Never used  Substance Use Topics  . Alcohol use: Not Currently  . Drug use: Never    Home Medications Prior to Admission medications   Medication Sig Start Date End Date Taking? Authorizing Provider  enoxaparin (LOVENOX) 30 MG/0.3ML injection Inject 0.4 mLs (40 mg total) into the skin daily. Patient not taking: Reported on 11/09/2018 10/11/18   Hermina Staggers, MD  escitalopram (LEXAPRO) 10 MG tablet Take 1 tablet (10 mg total) by mouth daily. 10/12/18   Hermina Staggers, MD  ibuprofen (ADVIL,MOTRIN) 600 MG tablet Take 1 tablet (600 mg total) by mouth every 6 (six) hours. Patient not taking: Reported on 11/09/2018 10/11/18   Hermina Staggers, MD  Levonorgestrel (LILETTA, 52 MG,) 19.5 MCG/DAY IUD IUD 1 each by Intrauterine route once.    [provider]  metroNIDAZOLE (FLAGYL) 500 MG tablet Take 1 tablet (500 mg total) by mouth 2 (two) times daily. 11/13/18   Levie Heritage, DO  metroNIDAZOLE (FLAGYL) 500 MG tablet Take 1 tablet (500 mg total) by mouth 2 (two) times daily. 10/03/20   Carroll Sage, PA-C  Prenatal Vit-Fe Fumarate-FA (PRENATAL MULTIVITAMIN) TABS tablet Take 1 tablet by mouth daily at 12 noon. Patient not taking: Reported on 11/09/2018 10/11/18   Hermina Staggers, MD   triamterene-hydrochlorothiazide (MAXZIDE-25) 37.5-25 MG tablet Take 1 tablet by mouth daily. Patient not taking: Reported on 11/09/2018 10/11/18   Hermina Staggers, MD    Allergies    Shellfish allergy  Review of Systems   Review of Systems  Constitutional: Negative for chills and fever.  HENT: Negative for congestion, trouble swallowing and voice change.   Eyes: Negative for visual disturbance.  Respiratory: Negative for shortness of breath.   Cardiovascular: Negative for chest pain and palpitations.  Gastrointestinal: Negative for abdominal pain, diarrhea, nausea and vomiting.  Genitourinary: Positive for vaginal discharge. Negative for dyspareunia, dysuria, enuresis, hematuria and pelvic pain.  Musculoskeletal: Negative for back pain.       Groin mass on the right side.  Skin: Negative for rash.  Neurological: Negative for dizziness, light-headedness and headaches.  Hematological: Does not bruise/bleed easily.    Physical Exam Updated Vital Signs BP 126/82 (BP Location: Right Arm)   Pulse 78   Temp 98.2 F (36.8 C) (Oral)   Resp 18   Ht 5' 4.25" (1.632 m)   Wt 59.5 kg   SpO2 100%   BMI 22.35 kg/m   Physical Exam Vitals and nursing note reviewed. Exam conducted with a chaperone present.  Constitutional:      General: She is not in acute distress.    Appearance: Normal appearance. She is not ill-appearing.  HENT:     Head: Normocephalic and atraumatic.     Nose: Rhinorrhea present. No congestion.     Mouth/Throat:     Mouth: Mucous membranes are moist.     Pharynx: Oropharynx is clear.  Eyes:     General: No scleral icterus.       Right eye: No discharge.        Left eye: No discharge.  Cardiovascular:     Rate and Rhythm: Normal rate and regular rhythm.     Pulses: Normal pulses.     Heart sounds: No murmur heard.  No friction rub. No gallop.   Pulmonary:     Effort: No respiratory distress.     Breath sounds: No stridor. No wheezing, rhonchi or rales.   Abdominal:     General: There is no distension.     Palpations: Abdomen is soft.     Tenderness: There is no abdominal tenderness. There is no right CVA tenderness, left CVA tenderness or guarding.  Genitourinary:    Vagina: No vaginal discharge.     Comments: Pelvic exam performed showed  small 1 cm in diameter mass in the right groin.  It was mobile, not fixated, tender to palpation,  no erythema, edema, discharge or drainage noted.  Vaginal canal was patent, pink, no lesions or trauma noted.  Cervix was visualized there was no lesions, discharge, or other abnormalities noted. Musculoskeletal:        General: No swelling or tenderness.     Right lower leg: No edema.     Left lower leg: No edema.  Skin:    General: Skin is warm and dry.     Findings: No rash.  Neurological:     Mental Status: She is alert and oriented to person, place, and time.  Psychiatric:        Mood and Affect: Mood normal.     ED Results / Procedures / Treatments   Labs (all labs ordered are listed, but only abnormal results are displayed) Labs Reviewed  WET PREP, GENITAL - Abnormal; Notable for the following components:      Result Value   Trich, Wet Prep PRESENT (*)    Clue Cells Wet Prep HPF POC PRESENT (*)    WBC, Wet Prep HPF POC MANY (*)    All other components within normal limits  CBC WITH DIFFERENTIAL/PLATELET - Abnormal; Notable for the following components:   Hemoglobin 15.3 (*)    All other components within normal limits  URINALYSIS, ROUTINE W REFLEX MICROSCOPIC - Abnormal; Notable for the following components:   Hgb urine dipstick SMALL (*)    All other components within normal limits  URINALYSIS, MICROSCOPIC (REFLEX) - Abnormal; Notable for the following components:   Bacteria, UA MANY (*)    Trichomonas, UA PRESENT (*)    All other components within normal limits  PREGNANCY, URINE  RPR  HIV ANTIBODY (ROUTINE TESTING W REFLEX)  GC/CHLAMYDIA PROBE AMP ([place]) NOT AT [facility]   GC/CHLAMYDIA PROBE AMP ([place]) NOT AT [facility]    EKG None  Radiology US Pelvis Limited  Result Date: 10/03/2020 CLINICAL DATA:  Right groin mass for 3 days EXAM: ULTRASOUND OF RIGHT GROIN SOFT TISSUES TECHNIQUE: Ultrasound examination of the groin soft tissues was performed in the area of clinical concern. COMPARISON:  None. FINDINGS: Targeted ultrasound was performed at the area of patient's clinical concern within the right groin. Within the soft tissues of the right groin is a reniform hypoechoic mass with central echogenic hila measuring 2.1 x 1.7 x 2.4 cm. Morphology is most compatible with an enlarged lymph node. There is marked hypervascularity within the hila and capsule. The surrounding subcutaneous fat is echogenic with some mild soft tissue edema. IMPRESSION: Enlarged hypervascular lymph node within the soft tissues of the right groin corresponding to patient's palpable abnormality. This may be a reactive node in the setting of regional infection. Close clinical follow-up is recommended to ensure resolution as a malignant lymph node is not excluded. If this finding persists or enlarges, tissue sampling may be indicated. Electronically Signed   By: Duanne GuessNicholas  Plundo D.O.   On: 10/03/2020 16:28    Procedures Pelvic exam  Date/Time: 10/03/2020 4:22 PM Performed by: Carroll SageFaulkner, Aliciana Ricciardi J, PA-C Authorized by: Carroll SageFaulkner, Anaiza Behrens J, PA-C  Preparation: Patient was prepped and draped in the usual sterile fashion. Local anesthesia used: no  Anesthesia: Local anesthesia used: no  Sedation: Patient sedated: no  Patient tolerance: patient tolerated the procedure well with no immediate complications    (including critical care time)  Medications Ordered in ED Medications - No data to display  ED Course  I have reviewed the triage vital signs and the nursing notes.  Pertinent labs &  imaging results that were available during my care of the patient were reviewed by me and  considered in my medical decision making (see chart for details).    MDM Rules/Calculators/A&P                          I have personally reviewed all imaging, labs and have interpreted them.  Patient presents with right-sided groin mass and vaginal discharge.  She is alert, did not appear in acute distress, vital signs reassuring.  Will order CBC, UA, perform a pelvic exam and obtain STD testing.  Pelvic exam with chaperone performed showed a small mass in the right groin likely a enlarged lymph node will order ultrasound for further evaluation.  No vaginal discharge or other gross numbers noted, no vaginal tenderness during exam.  CBC negative for nitrates or leukocytes.  Urine pregnancy was negative.  Wet prep shows trichomonas, clue cells, many white blood cells.  Will treat patient for trichomonas as well as bacterial vaginosis.  UA shows small hemoglobin, many bacteria, trichomonas present.  Ultrasound shows enlarged hypervascular lymph node within the soft tissue.  Most likely reflects reactive node in the setting of infection but cannot exclude malignant node recommends close follow-up.  I have low suspicion for UTI or pyelonephritis as patient denies urinary symptoms, denies abdominal pain, nausea or vomiting, no CVA tenderness noted on exam, UA negative for nitrates or leukocytes, no red blood cells noted.  Low suspicion for ovarian torsion or ectopic pregnancy as patient denies pelvic pain, no vaginal bleeding, urine pregnancy was negative, patient's pelvis was nontender to palpation during pelvic exam.  Low suspicion for systemic infection as patient is nontoxic-appearing, vital signs reassuring, CBC negative for leukocytosis.  Low suspicion for STD as patient is denying vaginal pain or vaginal bleeding, will defer treatment at this time and await results.  Low suspicion for malignancy as patient denies unexplained weight loss, night sweats, bone pain, no history of malignancy.  I suspect  patient's vaginal discharge is secondary to trichomonas positive as well as having BV.  Will start patient on antibiotics encourage abstinence for 2 weeks and have her partner checked and treated for trichomonas.  I suspect patient's enlarged lymph node is secondary to her BV or possible trichomonas infection.  Will recommend that she has close follow-up with PCP for further evaluation.  Vital signs remained stable, no indication for hospital admission.  Patient was given at home care as well strict return precautions.  Verbalized that she understood agreed to said plan.    Final Clinical Impression(s) / ED Diagnoses Final diagnoses:  BV (bacterial vaginosis)  Trichomonas infection  Enlarged lymph node    Rx / DC Orders ED Discharge Orders         Ordered    metroNIDAZOLE (FLAGYL) 500 MG tablet  2 times daily        10/03/20 1641           Barnie Del 10/03/20 1650    Linwood Dibbles, MD 10/03/20 [phone]

## 2020-10-03 NOTE — Discharge Instructions (Addendum)
Lab work shows that you have trichomonas as well as bacterial vaginosis.  I prescribed antibiotics please take as prescribed.  It is important that you do not drink alcohol while taking this medication as it can cause a bad reaction causing you to vomit.  It is also important that you abstain from sexual intercourse for 2 weeks.  Your gonorrhea, chlamydia, syphilis and HIV test are pending you can find the results on MyChart.  Your partner needs to be checked for trichomonas and treated as well.  Imaging of your mass shows that you have an enlarged lymph node.  Recommend that you have this follow-up done in 3 weeks time by your primary care provider.   information for [facility] health department they performed STD checks as well as provide treatment.  Come back if develop chest pain, shortness of breath, severe abdominal pain, uncontrolled nausea, vomiting, diarrhea

## 2020-10-04 LAB — RPR: RPR Ser Ql: NONREACTIVE

## 2020-10-06 LAB — GC/CHLAMYDIA PROBE AMP (~~LOC~~) NOT AT ARMC
Chlamydia: NEGATIVE
Comment: NEGATIVE
Comment: NORMAL
Neisseria Gonorrhea: NEGATIVE

## 2021-10-23 IMAGING — US US PELVIS LIMITED
1 series · 13 of 13 positions shown · non-contrast
Comparison: None.

CLINICAL DATA: Right groin mass for 3 days

EXAM:
ULTRASOUND OF RIGHT GROIN SOFT TISSUES
TECHNIQUE: Ultrasound examination of the groin soft tissues was performed in
the area of clinical concern.

[Series 1: us pelvis limited · 13 of 13 slices shown]
[im 1/13]
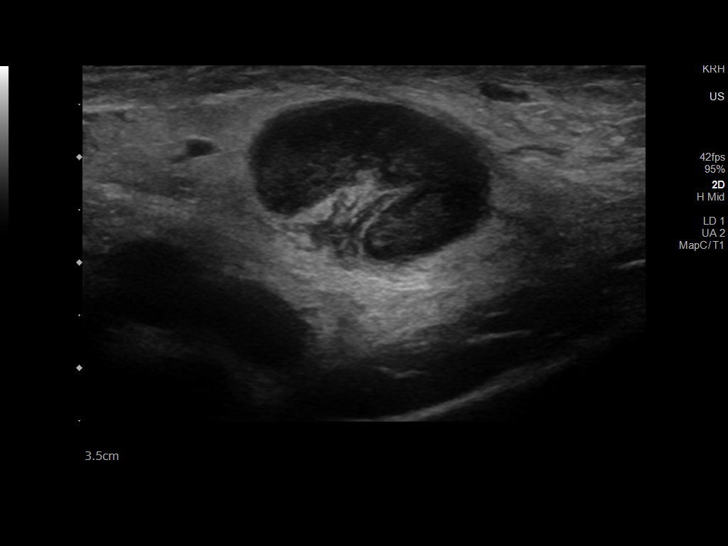
[im 2/13]
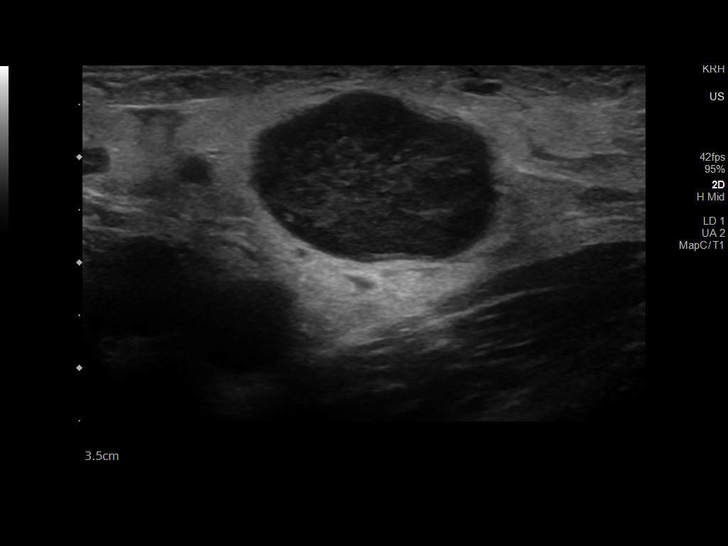
[im 3/13]
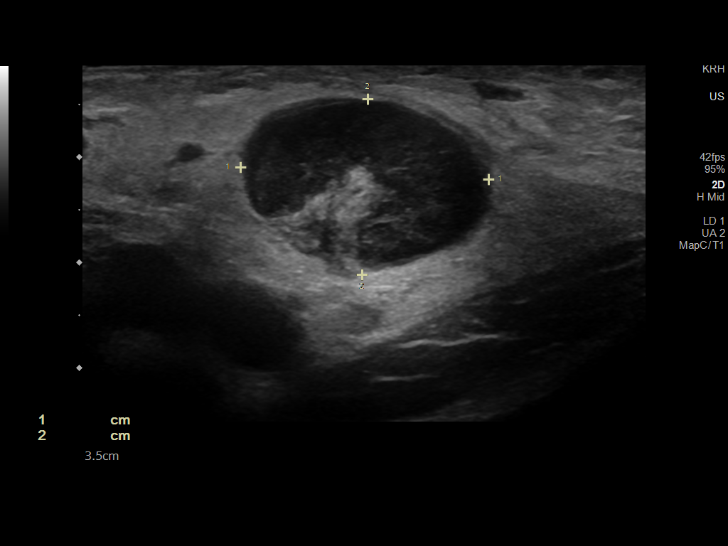
[im 4/13]
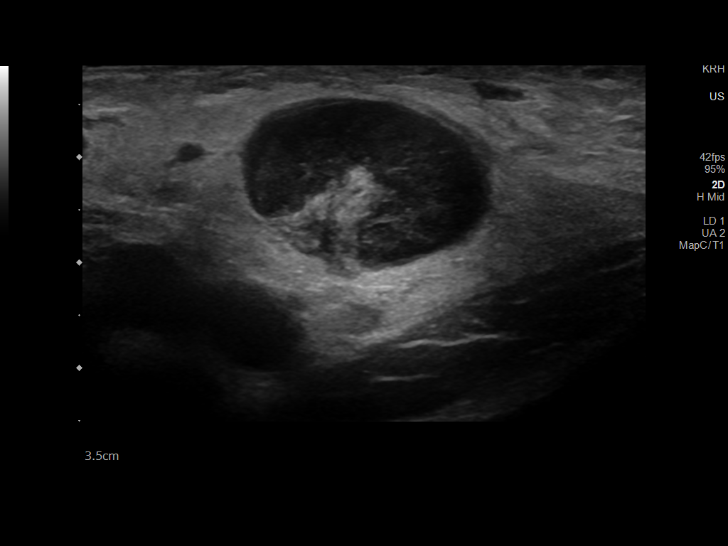
[im 5/13]
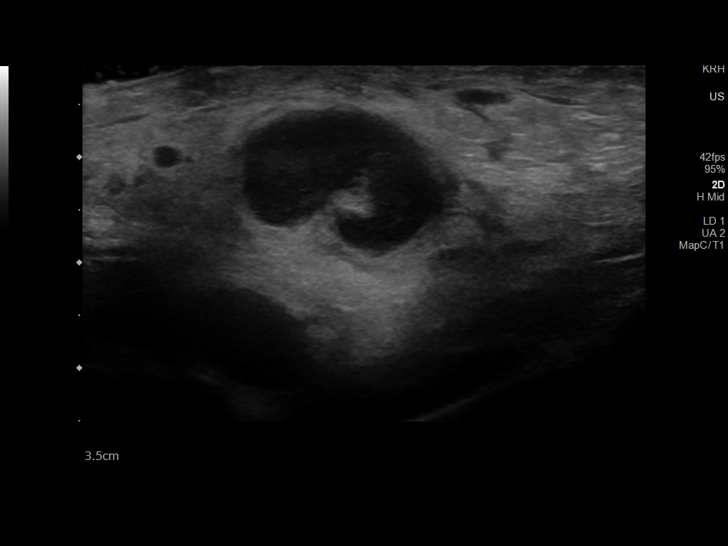
[im 6/13]
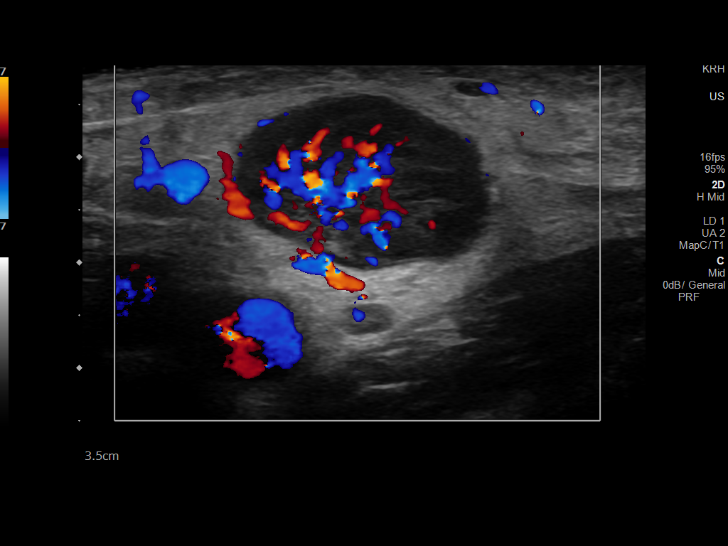
[im 7/13]
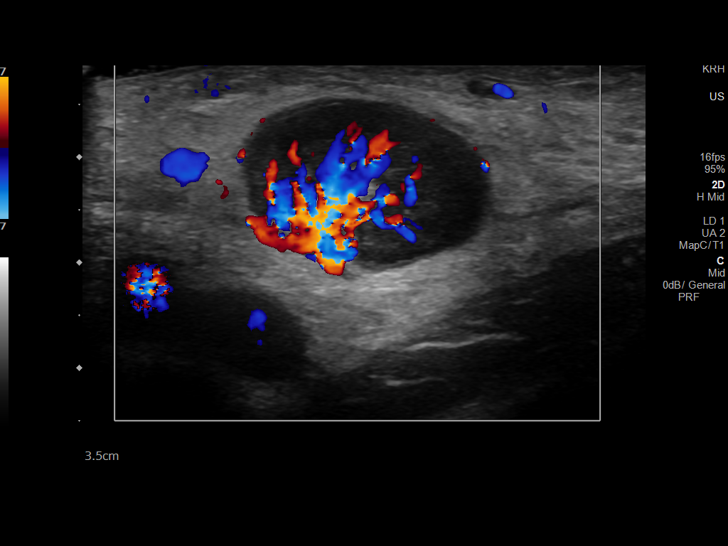
[im 8/13]
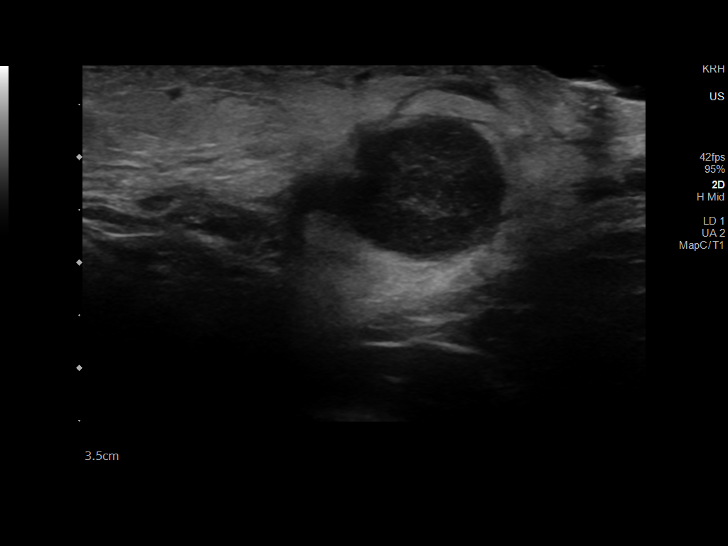
[im 9/13]
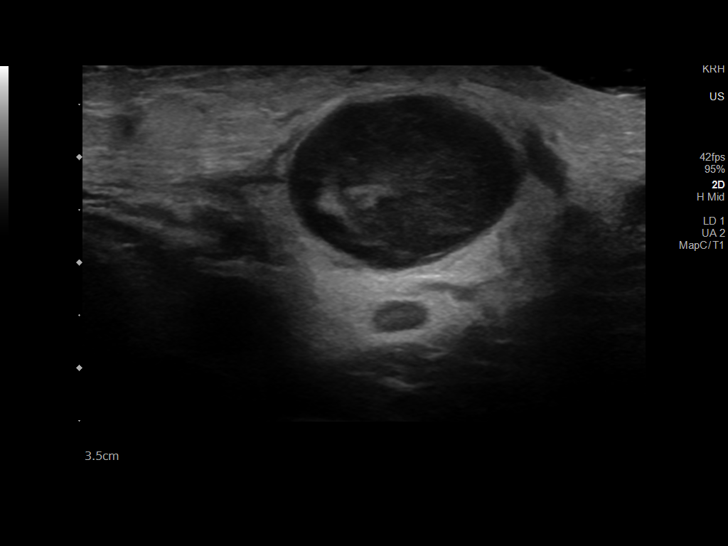
[im 10/13]
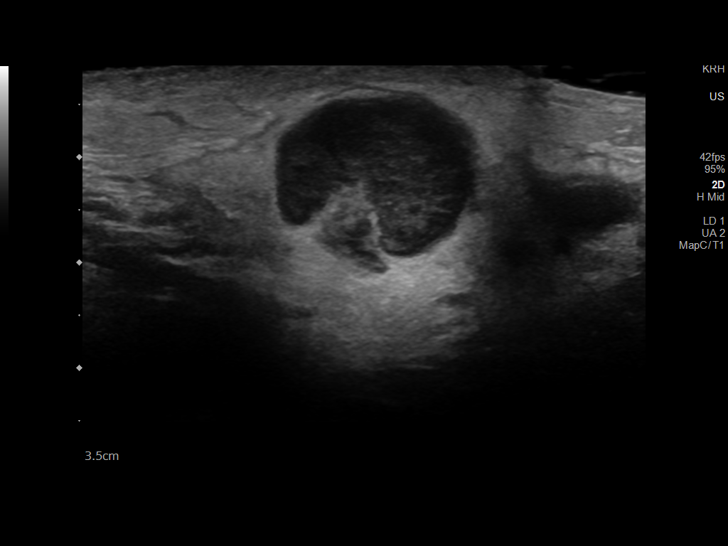
[im 11/13]
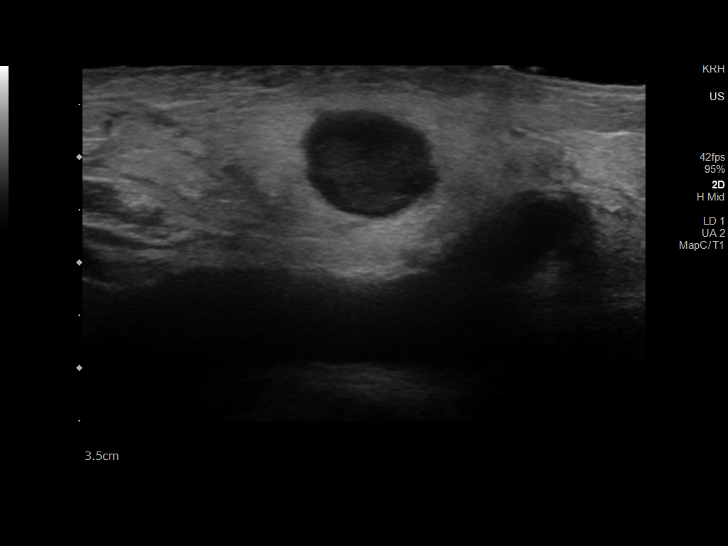
[im 12/13]
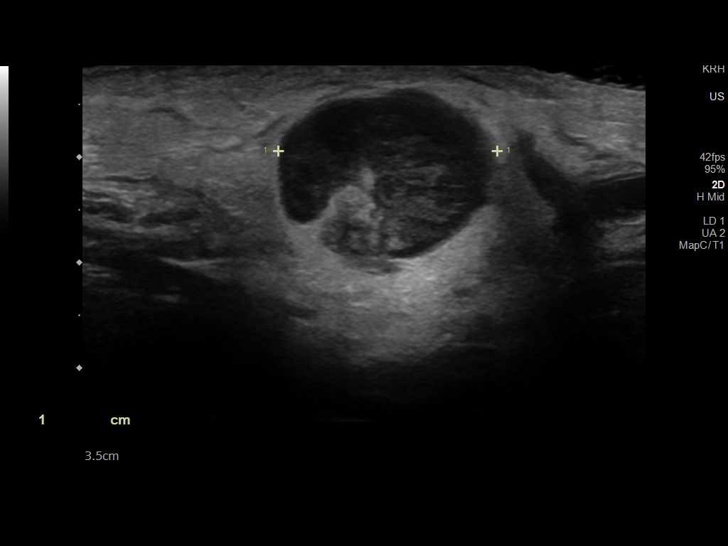
[im 13/13]
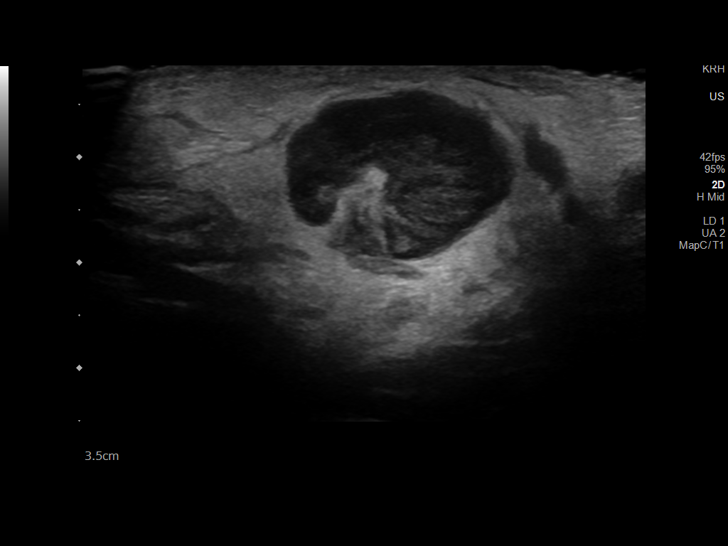

[13 of 13 positions shown; findings below may reference images not displayed]

FINDINGS: Targeted ultrasound was performed at the area of patient's clinical
concern within the right groin. Within the soft tissues of the right
groin is a reniform hypoechoic mass with central echogenic hila
measuring 2.1 x 1.7 x 2.4 cm. Morphology is most compatible with an
enlarged lymph node. There is marked hypervascularity within the
hila and capsule. The surrounding subcutaneous fat is echogenic with
some mild soft tissue edema.
IMPRESSION: Enlarged hypervascular lymph node within the soft tissues of the
right groin corresponding to patient's palpable abnormality. This
may be a reactive node in the setting of regional infection. Close
clinical follow-up is recommended to ensure resolution as a
malignant lymph node is not excluded. If this finding persists or
enlarges, tissue sampling may be indicated.

## 2023-05-20 ENCOUNTER — Encounter (HOSPITAL_BASED_OUTPATIENT_CLINIC_OR_DEPARTMENT_OTHER): Payer: Self-pay

## 2023-05-20 ENCOUNTER — Emergency Department (HOSPITAL_BASED_OUTPATIENT_CLINIC_OR_DEPARTMENT_OTHER)
Admission: EM | Admit: 2023-05-20 | Discharge: 2023-05-20 | Disposition: A | Discharge status: Home / Self Care | Payer: BC Managed Care – PPO | Attending: Emergency Medicine | Admitting: Emergency Medicine

## 2023-05-20 DIAGNOSIS — L03221 Cellulitis of neck: Secondary | ICD-10-CM | POA: Insufficient documentation

## 2023-05-20 DIAGNOSIS — R590 Localized enlarged lymph nodes: Secondary | ICD-10-CM | POA: Diagnosis present

## 2023-05-20 HISTORY — DX: Deep phlebothrombosis in pregnancy, unspecified trimester: O22.30

## 2023-05-20 MED ORDER — CEFDINIR 300 MG PO CAPS: 300.0000 mg | ORAL_CAPSULE | Freq: Two times a day (BID) | ORAL | 0 refills | Status: AC

## 2023-05-20 NOTE — Discharge Instructions (Addendum)
As discussed, urine redness of skin as well as tenderness and warmth to the touch on her back, we will treat this with antibiotics for concerns for infection.  Regarding rash on your chin that was itchy, recommend hydrocortisone cream twice daily for 1 week.  Look for signs and symptoms of progression of infection like we discussed and return to the emergency department for seen by another clinician for further assessment.  Please do not hesitate to return to emergency room if the worrisome signs and symptoms we discussed become apparent.

## 2023-05-20 NOTE — ED Provider Notes (Signed)
EMERGENCY DEPARTMENT AT [facility] Provider Note   CSN: [id number] Arrival date & time: 05/20/23  1220     History  Chief Complaint  Patient presents with   Lymphadenopathy    Chelsea Hall is a 37 y.o. female.  HPI   37 year old female presents emergency department with complaints of rash under her chin, right-sided neck lymph node enlargement as well as swelling/redness/tenderness around right ear.  Patient states that symptoms began approximately 5 to 6 days ago when she noticed rash on her neck on the inner part of her chin.  States area initially was very itchy but has since become red and slightly tender to the touch.  Reports some clear drainage from area.  Notes subsequently developing right ear pain 2 days ago with some swelling and redness externally around the ear and noticing some lymph enlargement on the right side of her neck then.  Denies any drainage from ear.  Denies any fever, hearing deficits, chest pain, shortness of breath, nausea, vomiting, cough, sore throat, nasal drainage.  Denies any cat bites or scratches.   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 Authorizing Provider  cefdinir (OMNICEF) 300 MG capsule Take 1 capsule (300 mg total) by mouth 2 (two) times daily. 05/20/23  Yes Sherian Maroon A, PA  enoxaparin (LOVENOX) 30 MG/0.3ML injection Inject 0.4 mLs (40 mg total) into the skin daily. Patient not taking: Reported on 11/09/2018 10/11/18   Hermina Staggers, MD  escitalopram (LEXAPRO) 10 MG tablet Take 1 tablet (10 mg total) by mouth daily. 10/12/18   Hermina Staggers, MD  ibuprofen (ADVIL,MOTRIN) 600 MG tablet Take 1 tablet (600 mg total) by mouth every 6 (six) hours. Patient not taking: Reported on 11/09/2018 10/11/18   Hermina Staggers, MD  Levonorgestrel (LILETTA, 52 MG,) 19.5 MCG/DAY IUD IUD 1 each by Intrauterine route  once.    [provider]  metroNIDAZOLE (FLAGYL) 500 MG tablet Take 1 tablet (500 mg total) by mouth 2 (two) times daily. 11/13/18   Levie Heritage, DO  metroNIDAZOLE (FLAGYL) 500 MG tablet Take 1 tablet (500 mg total) by mouth 2 (two) times daily. 10/03/20   Carroll Sage, PA-C  Prenatal Vit-Fe Fumarate-FA (PRENATAL MULTIVITAMIN) TABS tablet Take 1 tablet by mouth daily at 12 noon. Patient not taking: Reported on 11/09/2018 10/11/18   Hermina Staggers, MD  triamterene-hydrochlorothiazide (MAXZIDE-25) 37.5-25 MG tablet Take 1 tablet by mouth daily. Patient not taking: Reported on 11/09/2018 10/11/18   Hermina Staggers, MD      Allergies    Shellfish allergy    Review of Systems   Review of Systems  All other systems reviewed and are negative.   Physical Exam Updated Vital Signs BP (!) 121/91   Pulse 66   Temp 98.3 F (36.8 C) (Oral)   Resp 16   Ht 5\' 4"  (1.626 m)   Wt 64.4 kg   LMP 05/17/2023   SpO2 96%   BMI 24.37 kg/m  Physical Exam Vitals and nursing note reviewed.  Constitutional:      General: She is not in acute distress.    Appearance: She is well-developed.  HENT:     Head: Normocephalic and atraumatic.     Left Ear: Tympanic membrane, ear canal and external ear normal.     Ears:     Comments: Slightly erythematous appearing right-sided TM  without appreciable effusion.  Mild swelling, erythema and warmth appreciated just inferior to right ear.  No obvious swelling of right-sided parotid gland.  Area tender to the touch.  Right-sided cervical lymph enlargement which are nontender and nonerythematous.  No obvious tenderness to palpation of right-sided mastoid.    Mouth/Throat:     Mouth: Mucous membranes are moist.     Pharynx: Oropharynx is clear. No posterior oropharyngeal erythema.  Eyes:     Conjunctiva/sclera: Conjunctivae normal.  Cardiovascular:     Rate and Rhythm: Normal rate and regular rhythm.     Heart sounds: No murmur  heard. Pulmonary:     Effort: Pulmonary effort is normal. No respiratory distress.     Breath sounds: Normal breath sounds.  Abdominal:     Palpations: Abdomen is soft.     Tenderness: There is no abdominal tenderness.  Musculoskeletal:        General: No swelling.     Cervical back: Neck supple.  Skin:    General: Skin is warm and dry.     Capillary Refill: Capillary refill takes less than 2 seconds.  Neurological:     Mental Status: She is alert.  Psychiatric:        Mood and Affect: Mood normal.     ED Results / Procedures / Treatments   Labs (all labs ordered are listed, but only abnormal results are displayed) Labs Reviewed - No data to display  EKG None  Radiology No results found.  Procedures Procedures    Medications Ordered in ED Medications - No data to display  ED Course/ Medical Decision Making/ A&P                             Medical Decision Making Risk Prescription drug management.   This patient presents to the ED for concern of rash, this involves an extensive number of treatment options, and is a complaint that carries with it a high risk of complications and morbidity.  The differential diagnosis includes cellulitis, erysipelas, necrotizing fasciitis, abscess formation, contact otitis, SJS/TN   Co morbidities that complicate the patient evaluation  See HPI   Additional history obtained:  Additional history obtained from EMR External records from outside source obtained and reviewed including hospital records   Lab Tests:  N/a   Imaging Studies ordered:  N/a   Cardiac Monitoring: / EKG:  The patient was maintained on a cardiac monitor.  I personally viewed and interpreted the cardiac monitored which showed an underlying rhythm of: Sinus rhythm   Consultations Obtained:  N/a   Problem List / ED Course / Critical interventions / Medication management  Rash Reevaluation of the patient showed that the patient stayed the  same I have reviewed the patients home medicines and have made adjustments as needed   Social Determinants of Health:  Denies tobacco,   Test / Admission - Considered:  Rash Vitals signs within normal range and stable throughout visit. 37 year old female presents emergency department complaints of rash, redness and swelling.  Patient with evidence of areas of cellulitis as well as initial rash concerning for contact dermatitis.  Will treat patient with antibiotics orally as well as topical steroids over rash concerning for contact dermatitis.  Patient with what appears to be reactive lymph nodes of right anterior neck.  Patient recommended close follow-up in outpatient setting with primary care for further assessment.  Patient overall well-appearing, afebrile in no acute distress.  Further  workup deemed unnecessary at this time while emergency department.  Treatment plan discussed length with patient and she acknowledged understanding was agreeable to said plan. Worrisome signs and symptoms were discussed with the patient, and the patient acknowledged understanding to return to the ED if noticed. Patient was stable upon discharge.          Final Clinical Impression(s) / ED Diagnoses Final diagnoses:  Cellulitis of neck    Rx / DC Orders ED Discharge Orders          Ordered    cefdinir (OMNICEF) 300 MG capsule  2 times daily        05/20/23 1416              Peter Garter, [place] 05/20/23 1555    Loetta Rough, MD 05/26/23 1142

## 2023-05-20 NOTE — ED Triage Notes (Signed)
Pt reports hives under chin since Monday which has improved. Now has possible enlarged lymph node under right ear. X 2 days.
